# Patient Record
Sex: Female | Born: 1960 | Race: White | Hispanic: No | Marital: Married | Smoking: Current every day smoker
Health system: Southern US, Community
[De-identification: ages and names within clinical notes are randomized; demographics above are authoritative.]

## PROBLEM LIST (undated history)

## (undated) DIAGNOSIS — I10 Essential (primary) hypertension: Secondary | ICD-10-CM

## (undated) DIAGNOSIS — J45909 Unspecified asthma, uncomplicated: Secondary | ICD-10-CM

## (undated) DIAGNOSIS — M545 Low back pain, unspecified: Secondary | ICD-10-CM

## (undated) DIAGNOSIS — M1990 Unspecified osteoarthritis, unspecified site: Secondary | ICD-10-CM

## (undated) DIAGNOSIS — Z8489 Family history of other specified conditions: Secondary | ICD-10-CM

## (undated) DIAGNOSIS — Z8739 Personal history of other diseases of the musculoskeletal system and connective tissue: Secondary | ICD-10-CM

## (undated) DIAGNOSIS — Z87442 Personal history of urinary calculi: Secondary | ICD-10-CM

## (undated) DIAGNOSIS — G2581 Restless legs syndrome: Secondary | ICD-10-CM

## (undated) DIAGNOSIS — Z9109 Other allergy status, other than to drugs and biological substances: Secondary | ICD-10-CM

## (undated) DIAGNOSIS — J302 Other seasonal allergic rhinitis: Secondary | ICD-10-CM

## (undated) DIAGNOSIS — E785 Hyperlipidemia, unspecified: Secondary | ICD-10-CM

## (undated) DIAGNOSIS — R748 Abnormal levels of other serum enzymes: Secondary | ICD-10-CM

## (undated) DIAGNOSIS — N200 Calculus of kidney: Secondary | ICD-10-CM

## (undated) DIAGNOSIS — G8929 Other chronic pain: Secondary | ICD-10-CM

## (undated) DIAGNOSIS — K219 Gastro-esophageal reflux disease without esophagitis: Secondary | ICD-10-CM

## (undated) DIAGNOSIS — G5600 Carpal tunnel syndrome, unspecified upper limb: Secondary | ICD-10-CM

## (undated) DIAGNOSIS — J42 Unspecified chronic bronchitis: Secondary | ICD-10-CM

## (undated) HISTORY — PX: ABDOMINAL HYSTERECTOMY: SHX81

## (undated) HISTORY — PX: APPENDECTOMY: SHX54

## (undated) HISTORY — PX: TONSILLECTOMY: SUR1361

## (undated) HISTORY — PX: FRACTURE SURGERY: SHX138

## (undated) HISTORY — PX: CYSTOSCOPY W/ STONE MANIPULATION: SHX1427

## (undated) HISTORY — PX: ANKLE SURGERY: SHX546

## (undated) HISTORY — PX: JOINT REPLACEMENT: SHX530

---

## 1998-11-05 ENCOUNTER — Inpatient Hospital Stay (HOSPITAL_COMMUNITY): Admission: EM | Admit: 1998-11-05 | Discharge: 1998-11-09 | Payer: Self-pay | Admitting: Emergency Medicine

## 1998-11-06 ENCOUNTER — Encounter: Payer: Self-pay | Admitting: Orthopedic Surgery

## 1998-11-06 ENCOUNTER — Encounter: Payer: Self-pay | Admitting: Emergency Medicine

## 1999-03-28 ENCOUNTER — Other Ambulatory Visit: Admission: RE | Admit: 1999-03-28 | Discharge: 1999-03-28 | Payer: Self-pay | Admitting: Obstetrics and Gynecology

## 2000-05-19 ENCOUNTER — Other Ambulatory Visit: Admission: RE | Admit: 2000-05-19 | Discharge: 2000-05-19 | Payer: Self-pay | Admitting: Obstetrics and Gynecology

## 2001-10-04 ENCOUNTER — Other Ambulatory Visit: Admission: RE | Admit: 2001-10-04 | Discharge: 2001-10-04 | Payer: Self-pay | Admitting: Obstetrics and Gynecology

## 2003-12-13 ENCOUNTER — Other Ambulatory Visit: Admission: RE | Admit: 2003-12-13 | Discharge: 2003-12-13 | Payer: Self-pay | Admitting: Obstetrics and Gynecology

## 2009-09-26 ENCOUNTER — Encounter: Admission: RE | Admit: 2009-09-26 | Discharge: 2009-09-26 | Payer: Self-pay | Admitting: Podiatry

## 2012-06-18 ENCOUNTER — Emergency Department (HOSPITAL_BASED_OUTPATIENT_CLINIC_OR_DEPARTMENT_OTHER)
Admission: EM | Admit: 2012-06-18 | Discharge: 2012-06-18 | Disposition: A | Discharge status: Home / Self Care | Payer: Medicare Other | Attending: Emergency Medicine | Admitting: Emergency Medicine

## 2012-06-18 ENCOUNTER — Emergency Department (HOSPITAL_BASED_OUTPATIENT_CLINIC_OR_DEPARTMENT_OTHER): Payer: Medicare Other

## 2012-06-18 ENCOUNTER — Encounter (HOSPITAL_BASED_OUTPATIENT_CLINIC_OR_DEPARTMENT_OTHER): Payer: Self-pay | Admitting: *Deleted

## 2012-06-18 DIAGNOSIS — Y929 Unspecified place or not applicable: Secondary | ICD-10-CM | POA: Insufficient documentation

## 2012-06-18 DIAGNOSIS — I10 Essential (primary) hypertension: Secondary | ICD-10-CM | POA: Insufficient documentation

## 2012-06-18 DIAGNOSIS — Y9301 Activity, walking, marching and hiking: Secondary | ICD-10-CM | POA: Insufficient documentation

## 2012-06-18 DIAGNOSIS — S20219A Contusion of unspecified front wall of thorax, initial encounter: Secondary | ICD-10-CM | POA: Insufficient documentation

## 2012-06-18 DIAGNOSIS — S0990XA Unspecified injury of head, initial encounter: Secondary | ICD-10-CM | POA: Insufficient documentation

## 2012-06-18 DIAGNOSIS — W010XXA Fall on same level from slipping, tripping and stumbling without subsequent striking against object, initial encounter: Secondary | ICD-10-CM | POA: Insufficient documentation

## 2012-06-18 DIAGNOSIS — F17200 Nicotine dependence, unspecified, uncomplicated: Secondary | ICD-10-CM | POA: Insufficient documentation

## 2012-06-18 DIAGNOSIS — K219 Gastro-esophageal reflux disease without esophagitis: Secondary | ICD-10-CM | POA: Insufficient documentation

## 2012-06-18 DIAGNOSIS — Z79899 Other long term (current) drug therapy: Secondary | ICD-10-CM | POA: Insufficient documentation

## 2012-06-18 DIAGNOSIS — S20212A Contusion of left front wall of thorax, initial encounter: Secondary | ICD-10-CM

## 2012-06-18 HISTORY — DX: Gastro-esophageal reflux disease without esophagitis: K21.9

## 2012-06-18 HISTORY — DX: Essential (primary) hypertension: I10

## 2012-06-18 MED ORDER — HYDROCODONE-ACETAMINOPHEN 5-325 MG PO TABS: 2.0000 | ORAL_TABLET | ORAL | Status: DC | PRN

## 2012-06-18 MED ORDER — ACETAMINOPHEN 325 MG PO TABS
ORAL_TABLET | ORAL | Status: AC
  Administered 2012-06-18: 650 mg
  Filled 2012-06-18: qty 2

## 2012-06-18 NOTE — ED Provider Notes (Signed)
History     CSN: [id number]  Arrival date & time 06/18/12  1301   First MD Initiated Contact with Patient 06/18/12 1333      Chief Complaint  Patient presents with  . Fall    (Consider location/radiation/quality/duration/timing/severity/associated sxs/prior treatment) Patient is a 52 y.o. female presenting with fall. The history is provided by the patient. No language interpreter was used.  Fall The accident occurred 3 to 5 hours ago. The fall occurred while walking. She fell from a height of 1 to 2 ft. There was no blood loss. Point of impact: left ribs. The pain is at a severity of 5/10. The pain is moderate. She was not ambulatory at the scene. There was no entrapment after the fall. There was no drug use involved in the accident. There was no alcohol use involved in the accident. Associated symptoms include headaches. Pertinent negatives include no abdominal pain, no nausea, no vomiting and no loss of consciousness. The symptoms are aggravated by pressure on the injury. She has tried nothing for the symptoms.  Pt complains of falling and hitting right side of chest on the tub.  Past Medical History  Diagnosis Date  . Hypertension   . GERD (gastroesophageal reflux disease)     Past Surgical History  Procedure Laterality Date  . Tonsillectomy    . Abdominal hysterectomy      No family history on file.  History  Substance Use Topics  . Smoking status: Current Every Day Smoker -- 0.50 packs/day    Types: Cigarettes  . Smokeless tobacco: Not on file  . Alcohol Use: Yes    OB History   Grav Para Term Preterm Abortions TAB SAB Ect Mult Living                  Review of Systems  Respiratory: Negative for chest tightness and shortness of breath.   Gastrointestinal: Negative for nausea, vomiting and abdominal pain.  Neurological: Positive for headaches. Negative for loss of consciousness.  All other systems reviewed and are negative.    Allergies  Sulfa  antibiotics  Home Medications   Current Outpatient Rx  Name  Route  Sig  Dispense  Refill  . Esomeprazole Magnesium (NEXIUM PO)   Oral   Take by mouth.         Marland Kitchen HYDROCHLOROTHIAZIDE PO   Oral   Take by mouth.           BP 124/92  Pulse 101  Temp(Src) 98.8 F (37.1 C) (Oral)  Resp 20  SpO2 100%  Physical Exam  Nursing note and vitals reviewed. Constitutional: She appears well-developed and well-nourished.  HENT:  Head: Normocephalic.  Right Ear: External ear normal.  Left Ear: External ear normal.  Eyes: Conjunctivae and EOM are normal. Pupils are equal, round, and reactive to light.  Neck: Normal range of motion. Neck supple.  Pulmonary/Chest: Effort normal and breath sounds normal. She exhibits tenderness.  Abdominal: Soft.  Musculoskeletal: Normal range of motion.  Neurological: She is alert.  Skin: Skin is warm.    ED Course  Procedures (including critical care time)  Labs Reviewed - No data to display No results found.   1. Contusion, chest wall, left, initial encounter       MDM  Left ribs  No obvious fracture.   Pt given rx for hydrocodone.  Pt advised to recheck with her Md in 3-4 days     Date: 06/18/2012  Rate: 92  Rhythm: normal sinus rhythm  QRS Axis: normal  Intervals: normal  ST/T Wave abnormalities: normal  Conduction Disutrbances:none  Narrative Interpretation:   Old EKG Reviewed: none available    Elson Areas, PA-C 06/18/12 1453  Lonia Skinner [place], PA-C 06/18/12 1455  Lonia Skinner [place], [place] 06/18/12 1457

## 2012-06-18 NOTE — ED Provider Notes (Signed)
Medical screening examination/treatment/procedure(s) were performed by non-physician practitioner and as supervising physician I was immediately available for consultation/collaboration.  Doug Sou, MD 06/18/12 [phone]

## 2012-06-18 NOTE — ED Notes (Signed)
Family at bedside. 

## 2012-06-18 NOTE — ED Notes (Signed)
sllipped on water and fell. Pain in her left scapula and shoulder. Pressure in her left chest and numbness to her left hand.

## 2015-03-14 ENCOUNTER — Encounter (HOSPITAL_COMMUNITY): Payer: Self-pay | Admitting: Emergency Medicine

## 2015-03-14 ENCOUNTER — Emergency Department (HOSPITAL_COMMUNITY)
Admission: EM | Admit: 2015-03-14 | Discharge: 2015-03-14 | Disposition: A | Discharge status: Home / Self Care | Payer: Medicare HMO | Attending: Emergency Medicine | Admitting: Emergency Medicine

## 2015-03-14 ENCOUNTER — Emergency Department (HOSPITAL_COMMUNITY): Payer: Medicare HMO

## 2015-03-14 DIAGNOSIS — Y998 Other external cause status: Secondary | ICD-10-CM | POA: Diagnosis not present

## 2015-03-14 DIAGNOSIS — Y92000 Kitchen of unspecified non-institutional (private) residence as  the place of occurrence of the external cause: Secondary | ICD-10-CM | POA: Insufficient documentation

## 2015-03-14 DIAGNOSIS — S42202A Unspecified fracture of upper end of left humerus, initial encounter for closed fracture: Secondary | ICD-10-CM | POA: Insufficient documentation

## 2015-03-14 DIAGNOSIS — K219 Gastro-esophageal reflux disease without esophagitis: Secondary | ICD-10-CM | POA: Insufficient documentation

## 2015-03-14 DIAGNOSIS — Z79899 Other long term (current) drug therapy: Secondary | ICD-10-CM | POA: Insufficient documentation

## 2015-03-14 DIAGNOSIS — Z7951 Long term (current) use of inhaled steroids: Secondary | ICD-10-CM | POA: Diagnosis not present

## 2015-03-14 DIAGNOSIS — Y9389 Activity, other specified: Secondary | ICD-10-CM | POA: Diagnosis not present

## 2015-03-14 DIAGNOSIS — I10 Essential (primary) hypertension: Secondary | ICD-10-CM | POA: Diagnosis not present

## 2015-03-14 DIAGNOSIS — F17210 Nicotine dependence, cigarettes, uncomplicated: Secondary | ICD-10-CM | POA: Insufficient documentation

## 2015-03-14 DIAGNOSIS — W010XXA Fall on same level from slipping, tripping and stumbling without subsequent striking against object, initial encounter: Secondary | ICD-10-CM | POA: Diagnosis not present

## 2015-03-14 DIAGNOSIS — S4992XA Unspecified injury of left shoulder and upper arm, initial encounter: Secondary | ICD-10-CM | POA: Diagnosis present

## 2015-03-14 DIAGNOSIS — S42302A Unspecified fracture of shaft of humerus, left arm, initial encounter for closed fracture: Secondary | ICD-10-CM

## 2015-03-14 MED ORDER — HYDROCODONE-ACETAMINOPHEN 5-325 MG PO TABS: 1.0000 | ORAL_TABLET | Freq: Four times a day (QID) | ORAL | Status: DC | PRN

## 2015-03-14 MED ORDER — FENTANYL CITRATE (PF) 100 MCG/2ML IJ SOLN
50.0000 ug | Freq: Once | INTRAMUSCULAR | Status: AC
  Administered 2015-03-14: 50 ug via INTRAMUSCULAR
  Filled 2015-03-14: qty 2

## 2015-03-14 NOTE — ED Notes (Signed)
Bed: WA06 Expected date:  Expected time:  Means of arrival:  Comments: 

## 2015-03-14 NOTE — Progress Notes (Signed)
CSW spoke with patient at bedside. No family was present during this encounter. Patient reports she resides at home. Patient reports she was "letting the dog in" and she tripped and fell over the [place] in her kitchen. Patient reports she can complete ADL's on her own. Patient reports her husband's name is Kloie Whiting. No questions noted for CSW at this time.   Eliyana Pagliaro, husband, [phone]  Elenore Paddy [phone] ED CSW 03/14/2015 10:14 AM

## 2015-03-14 NOTE — Discharge Instructions (Signed)
As discussed, with your arm fracture is very important that you monitor your condition carefully, use the sling as directed.  Please be sure to follow-up with her orthopedic physician in one week for repeat evaluation.  Return here for any concerning changes in your condition.

## 2015-03-14 NOTE — ED Notes (Signed)
Per [facility] pt from home had fall last night , ETOH on board per ems, had bottle of wine last night. Reports left shoulder pain , no obvious deformity per ems. Pt able to move left arm yet painful ROM. denies loc nor neck pain . Alert and oriented x 4.

## 2015-03-14 NOTE — ED Provider Notes (Signed)
CSN: [id number]     Arrival date & time 03/14/15  0827 History   First MD Initiated Contact with Patient 03/14/15 [phone]     Chief Complaint  Patient presents with  . Shoulder Injury    left  . Fall    HPI  Patient presents immediately after sustaining an injury to her left shoulder following a fall. Patient acknowledges using alcohol yesterday, denies any alcohol today. She states that just prior to calling EMS, she stumbled, fell against a countertop. She denies head trauma, confusion, disorientation, loss of consciousness, nausea, vomiting, neck pain. Since the event she has had severe sharp pain in the left shoulder, worse with any motion. No medication taken for relief. Patient denies detachment beyond hypertension. She acknowledges substantial life stress. She also smokes, drinks, including one bottle of wine last night.   Smoking cessation provided, particularly in light of this patient's evaluation in the ED.   Past Medical History  Diagnosis Date  . Hypertension   . GERD (gastroesophageal reflux disease)    Past Surgical History  Procedure Laterality Date  . Tonsillectomy    . Abdominal hysterectomy     No family history on file. Social History  Substance Use Topics  . Smoking status: Current Every Day Smoker -- 0.50 packs/day    Types: Cigarettes  . Smokeless tobacco: None  . Alcohol Use: Yes   OB History    No data available     Review of Systems  Constitutional: Negative for fever.  Respiratory: Negative for shortness of breath.   Cardiovascular: Negative for chest pain.  Musculoskeletal:       Negative aside from HPI  Skin:       Negative aside from HPI  Allergic/Immunologic: Negative for immunocompromised state.  Neurological: Negative for weakness.      Allergies  Sulfa antibiotics  Home Medications   Prior to Admission medications   Medication Sig Start Date End Date Taking? Authorizing Provider  ADVAIR DISKUS 100-50 MCG/DOSE AEPB  Inhale 1 puff into the lungs every 12 (twelve) hours. 12/06/14  Yes Historical Provider, MD  albuterol (PROVENTIL HFA;VENTOLIN HFA) 108 (90 Base) MCG/ACT inhaler Inhale 2 puffs into the lungs every 6 (six) hours as needed for wheezing or shortness of breath.   Yes Historical Provider, MD  fluticasone (FLONASE) 50 MCG/ACT nasal spray Place 2 sprays into both nostrils daily as needed for allergies.  02/10/15  Yes Historical Provider, MD  guaiFENesin-dextromethorphan (ROBITUSSIN DM) 100-10 MG/5ML syrup Take 10 mLs by mouth every 4 (four) hours as needed for cough.   Yes Historical Provider, MD  hydrochlorothiazide (HYDRODIURIL) 25 MG tablet Take 25 mg by mouth daily.   Yes Historical Provider, MD  Multiple Vitamin (MULTIVITAMIN WITH MINERALS) TABS tablet Take 1 tablet by mouth daily.   Yes Historical Provider, MD  pantoprazole (PROTONIX) 40 MG tablet Take 40 mg by mouth daily. 02/14/15  Yes Historical Provider, MD  tobramycin (TOBREX) 0.3 % ophthalmic solution Place 1-2 drops into both eyes every 4 (four) hours.  02/17/15  Yes Historical Provider, MD  HYDROcodone-acetaminophen (NORCO/VICODIN) 5-325 MG tablet Take 1 tablet by mouth every 6 (six) hours as needed for severe pain. 03/14/15   Gerhard Munch, MD   BP 136/81 mmHg  Pulse 96  Temp(Src) 97.6 F (36.4 C) (Oral)  Resp 20  SpO2 96% Physical Exam  Constitutional: She is oriented to person, place, and time. She appears well-developed and well-nourished. No distress.  HENT:  Head: Normocephalic and atraumatic.  Eyes: Conjunctivae  and EOM are normal.  Cardiovascular: Normal rate and regular rhythm.   Pulmonary/Chest: Effort normal and breath sounds normal. No stridor. No respiratory distress.  Abdominal: She exhibits no distension.  Musculoskeletal: She exhibits no edema.       Left shoulder: She exhibits decreased range of motion, tenderness, bony tenderness, swelling and deformity.       Left elbow: Normal.       Left wrist: Normal.   Clinical dislocation  Neurological: She is alert and oriented to person, place, and time. No cranial nerve deficit.  Skin: Skin is warm and dry.  Psychiatric: She has a normal mood and affect.  Nursing note and vitals reviewed.   ED Course  Procedures (including critical care time)  Imaging Review Dg Shoulder Left  03/14/2015  CLINICAL DATA:  Status post fall at home last night with a left shoulder injury. Pain. Initial encounter. EXAM: LEFT SHOULDER - 2+ VIEW COMPARISON:  None. FINDINGS: The patient has a mildly comminuted fracture of the proximal diaphysis of the left humerus. There is medial distraction of a large bony fragment. The humeral head is located and the acromioclavicular joint is intact. Imaged left lung and ribs are unremarkable. IMPRESSION: Acute fracture of the proximal diaphysis of the left humerus. Electronically Signed   By: Drusilla Kanner M.D.   On: 03/14/2015 09:25   I have personally reviewed and evaluated these images and lab results as part of my medical decision-making.  On repeat exam the patient is awake, alert. Patient has been placed in a left arm and shoulder sling. This is well tolerated.  I discussed patient's case with our orthopedist on call. The patient will follow-up with orthopedist within 1 week.   MDM   Final diagnoses:  Humerus fracture, left, closed, initial encounter   Patient presents after mechanical fall. Patient has left upper arm pain, deformity, but is distally neurovascularly intact. No other notable injuries. Patient is found to have comminuted humerus fracture. After discussion with our orthopedic team, the patient was discharged in stable condition with analgesia.  Gerhard Munch, MD 03/14/15 1043

## 2015-03-21 ENCOUNTER — Other Ambulatory Visit: Payer: Self-pay | Admitting: Family Medicine

## 2015-03-21 DIAGNOSIS — Z1231 Encounter for screening mammogram for malignant neoplasm of breast: Secondary | ICD-10-CM

## 2015-04-02 ENCOUNTER — Ambulatory Visit: Payer: Medicare HMO

## 2015-04-30 ENCOUNTER — Other Ambulatory Visit: Payer: Self-pay | Admitting: Orthopedic Surgery

## 2015-04-30 DIAGNOSIS — M25512 Pain in left shoulder: Secondary | ICD-10-CM

## 2015-05-01 ENCOUNTER — Ambulatory Visit
Admission: RE | Admit: 2015-05-01 | Discharge: 2015-05-01 | Disposition: A | Discharge status: Home / Self Care | Payer: Medicare HMO | Source: Ambulatory Visit | Attending: Orthopedic Surgery | Admitting: Orthopedic Surgery

## 2015-05-01 DIAGNOSIS — M25512 Pain in left shoulder: Secondary | ICD-10-CM

## 2015-05-02 ENCOUNTER — Encounter (HOSPITAL_COMMUNITY): Payer: Self-pay | Admitting: *Deleted

## 2015-05-02 ENCOUNTER — Other Ambulatory Visit: Payer: Self-pay | Admitting: Orthopedic Surgery

## 2015-05-02 NOTE — Progress Notes (Signed)
Pt denies cardiac history, chest pain or sob. 

## 2015-05-03 ENCOUNTER — Observation Stay (HOSPITAL_COMMUNITY)
Admission: RE | Admit: 2015-05-03 | Discharge: 2015-05-05 | Disposition: A | Discharge status: Home / Self Care | Payer: Medicare HMO | Source: Ambulatory Visit | Attending: Orthopedic Surgery | Admitting: Orthopedic Surgery

## 2015-05-03 ENCOUNTER — Encounter (HOSPITAL_COMMUNITY): Payer: Self-pay | Admitting: *Deleted

## 2015-05-03 ENCOUNTER — Ambulatory Visit (HOSPITAL_COMMUNITY): Payer: Medicare HMO | Admitting: Certified Registered"

## 2015-05-03 ENCOUNTER — Ambulatory Visit (HOSPITAL_COMMUNITY): Payer: Medicare HMO

## 2015-05-03 ENCOUNTER — Encounter (HOSPITAL_COMMUNITY)
Admission: RE | Disposition: A | Discharge status: Home / Self Care | Payer: Self-pay | Source: Ambulatory Visit | Attending: Orthopedic Surgery

## 2015-05-03 DIAGNOSIS — F17210 Nicotine dependence, cigarettes, uncomplicated: Secondary | ICD-10-CM | POA: Insufficient documentation

## 2015-05-03 DIAGNOSIS — S42292K Other displaced fracture of upper end of left humerus, subsequent encounter for fracture with nonunion: Secondary | ICD-10-CM | POA: Insufficient documentation

## 2015-05-03 DIAGNOSIS — G8929 Other chronic pain: Secondary | ICD-10-CM | POA: Insufficient documentation

## 2015-05-03 DIAGNOSIS — I10 Essential (primary) hypertension: Secondary | ICD-10-CM | POA: Insufficient documentation

## 2015-05-03 DIAGNOSIS — S4292XA Fracture of left shoulder girdle, part unspecified, initial encounter for closed fracture: Secondary | ICD-10-CM | POA: Diagnosis present

## 2015-05-03 DIAGNOSIS — S42352K Displaced comminuted fracture of shaft of humerus, left arm, subsequent encounter for fracture with nonunion: Secondary | ICD-10-CM | POA: Diagnosis not present

## 2015-05-03 DIAGNOSIS — Z79891 Long term (current) use of opiate analgesic: Secondary | ICD-10-CM | POA: Insufficient documentation

## 2015-05-03 DIAGNOSIS — S42232K 3-part fracture of surgical neck of left humerus, subsequent encounter for fracture with nonunion: Principal | ICD-10-CM | POA: Insufficient documentation

## 2015-05-03 DIAGNOSIS — Z419 Encounter for procedure for purposes other than remedying health state, unspecified: Secondary | ICD-10-CM

## 2015-05-03 DIAGNOSIS — X58XXXD Exposure to other specified factors, subsequent encounter: Secondary | ICD-10-CM | POA: Insufficient documentation

## 2015-05-03 DIAGNOSIS — K219 Gastro-esophageal reflux disease without esophagitis: Secondary | ICD-10-CM | POA: Insufficient documentation

## 2015-05-03 DIAGNOSIS — M545 Low back pain: Secondary | ICD-10-CM | POA: Insufficient documentation

## 2015-05-03 DIAGNOSIS — J45909 Unspecified asthma, uncomplicated: Secondary | ICD-10-CM | POA: Insufficient documentation

## 2015-05-03 DIAGNOSIS — M19072 Primary osteoarthritis, left ankle and foot: Secondary | ICD-10-CM | POA: Insufficient documentation

## 2015-05-03 DIAGNOSIS — Z79899 Other long term (current) drug therapy: Secondary | ICD-10-CM | POA: Insufficient documentation

## 2015-05-03 HISTORY — DX: Carpal tunnel syndrome, unspecified upper limb: G56.00

## 2015-05-03 HISTORY — DX: Restless legs syndrome: G25.81

## 2015-05-03 HISTORY — DX: Unspecified asthma, uncomplicated: J45.909

## 2015-05-03 HISTORY — DX: Low back pain: M54.5

## 2015-05-03 HISTORY — DX: Other chronic pain: G89.29

## 2015-05-03 HISTORY — DX: Family history of other specified conditions: Z84.89

## 2015-05-03 HISTORY — DX: Calculus of kidney: N20.0

## 2015-05-03 HISTORY — DX: Unspecified chronic bronchitis: J42

## 2015-05-03 HISTORY — DX: Other seasonal allergic rhinitis: J30.2

## 2015-05-03 HISTORY — PX: ORIF HUMERUS FRACTURE: SHX2126

## 2015-05-03 HISTORY — DX: Low back pain, unspecified: M54.50

## 2015-05-03 HISTORY — DX: Hyperlipidemia, unspecified: E78.5

## 2015-05-03 HISTORY — DX: Personal history of other diseases of the musculoskeletal system and connective tissue: Z87.39

## 2015-05-03 HISTORY — DX: Unspecified osteoarthritis, unspecified site: M19.90

## 2015-05-03 HISTORY — DX: Other allergy status, other than to drugs and biological substances: Z91.09

## 2015-05-03 HISTORY — DX: Abnormal levels of other serum enzymes: R74.8

## 2015-05-03 LAB — COMPREHENSIVE METABOLIC PANEL
ALBUMIN: 4.2 g/dL (ref 3.5–5.0)
ALT: 24 U/L (ref 14–54)
ANION GAP: 13 (ref 5–15)
AST: 30 U/L (ref 15–41)
Alkaline Phosphatase: 74 U/L (ref 38–126)
BUN: 9 mg/dL (ref 6–20)
CHLORIDE: 110 mmol/L (ref 101–111)
CO2: 21 mmol/L — AB (ref 22–32)
Calcium: 10.3 mg/dL (ref 8.9–10.3)
Creatinine, Ser: 0.66 mg/dL (ref 0.44–1.00)
GFR calc non Af Amer: >60 mL/min (ref >60)
Glucose, Bld: 94 mg/dL (ref 65–99)
Potassium: 3.6 mmol/L (ref 3.5–5.1)
SODIUM: 144 mmol/L (ref 135–145)
Total Bilirubin: 0.7 mg/dL (ref 0.3–1.2)
Total Protein: 7 g/dL (ref 6.5–8.1)

## 2015-05-03 LAB — CBC
HEMATOCRIT: 39.2 % (ref 36.0–46.0)
HEMOGLOBIN: 13.4 g/dL (ref 12.0–15.0)
MCH: 33.1 pg (ref 26.0–34.0)
MCHC: 34.2 g/dL (ref 30.0–36.0)
MCV: 96.8 fL (ref 78.0–100.0)
Platelets: 284 10*3/uL (ref 150–400)
RBC: 4.05 MIL/uL (ref 3.87–5.11)
RDW: 13.2 % (ref 11.5–15.5)
WBC: 9.2 10*3/uL (ref 4.0–10.5)

## 2015-05-03 SURGERY — OPEN REDUCTION INTERNAL FIXATION (ORIF) PROXIMAL HUMERUS FRACTURE
Anesthesia: General | Site: Shoulder | Laterality: Left

## 2015-05-03 MED ORDER — ONDANSETRON HCL 4 MG/2ML IJ SOLN
4.0000 mg | Freq: Four times a day (QID) | INTRAMUSCULAR | Status: DC | PRN
Start: 2015-05-03 — End: 2015-05-05

## 2015-05-03 MED ORDER — HYDROMORPHONE HCL 1 MG/ML IJ SOLN
INTRAMUSCULAR | Status: AC
  Administered 2015-05-03: 0.5 mg via INTRAVENOUS
  Filled 2015-05-03: qty 1

## 2015-05-03 MED ORDER — METHOCARBAMOL 1000 MG/10ML IJ SOLN: 500.0000 mg | Freq: Four times a day (QID) | INTRAMUSCULAR | Status: DC | PRN

## 2015-05-03 MED ORDER — LACTATED RINGERS IV SOLN
INTRAVENOUS | Status: DC
  Administered 2015-05-03: 10 mL/h via INTRAVENOUS
  Administered 2015-05-03 (×2): via INTRAVENOUS

## 2015-05-03 MED ORDER — CHLORHEXIDINE GLUCONATE 4 % EX LIQD: 60.0000 mL | Freq: Once | CUTANEOUS | Status: DC

## 2015-05-03 MED ORDER — ADULT MULTIVITAMIN W/MINERALS CH
1.0000 | ORAL_TABLET | Freq: Every day | ORAL | Status: DC
  Administered 2015-05-04 – 2015-05-05 (×2): 1 via ORAL
  Filled 2015-05-03 (×2): qty 1

## 2015-05-03 MED ORDER — PHENYLEPHRINE 40 MCG/ML (10ML) SYRINGE FOR IV PUSH (FOR BLOOD PRESSURE SUPPORT)
PREFILLED_SYRINGE | INTRAVENOUS | Status: AC
  Filled 2015-05-03: qty 10

## 2015-05-03 MED ORDER — CEFAZOLIN SODIUM-DEXTROSE 2-3 GM-% IV SOLR
2.0000 g | INTRAVENOUS | Status: AC
  Administered 2015-05-03: 2 g via INTRAVENOUS
  Filled 2015-05-03: qty 50

## 2015-05-03 MED ORDER — PANTOPRAZOLE SODIUM 40 MG PO TBEC
40.0000 mg | DELAYED_RELEASE_TABLET | Freq: Every day | ORAL | Status: DC
  Administered 2015-05-04 – 2015-05-05 (×2): 40 mg via ORAL
  Filled 2015-05-03 (×2): qty 1

## 2015-05-03 MED ORDER — FENTANYL CITRATE (PF) 250 MCG/5ML IJ SOLN
INTRAMUSCULAR | Status: AC
  Filled 2015-05-03: qty 5

## 2015-05-03 MED ORDER — LIDOCAINE HCL (CARDIAC) 20 MG/ML IV SOLN
INTRAVENOUS | Status: DC | PRN
  Administered 2015-05-03: 100 mg via INTRAVENOUS

## 2015-05-03 MED ORDER — MEPERIDINE HCL 25 MG/ML IJ SOLN: 6.2500 mg | INTRAMUSCULAR | Status: DC | PRN

## 2015-05-03 MED ORDER — OXYCODONE HCL 5 MG PO TABS
5.0000 mg | ORAL_TABLET | ORAL | Status: DC | PRN
  Administered 2015-05-03: 5 mg via ORAL
  Administered 2015-05-04 – 2015-05-05 (×6): 10 mg via ORAL
  Filled 2015-05-03 (×7): qty 2

## 2015-05-03 MED ORDER — VITAMIN D 1000 UNITS PO TABS
2000.0000 [IU] | ORAL_TABLET | Freq: Every day | ORAL | Status: DC
  Administered 2015-05-05: 2000 [IU] via ORAL
  Filled 2015-05-03 (×2): qty 2

## 2015-05-03 MED ORDER — METHOCARBAMOL 500 MG PO TABS
500.0000 mg | ORAL_TABLET | Freq: Four times a day (QID) | ORAL | Status: DC | PRN
  Administered 2015-05-03 – 2015-05-05 (×3): 500 mg via ORAL
  Filled 2015-05-03 (×3): qty 1

## 2015-05-03 MED ORDER — ACETAMINOPHEN 650 MG RE SUPP: 650.0000 mg | Freq: Four times a day (QID) | RECTAL | Status: DC | PRN

## 2015-05-03 MED ORDER — CEFAZOLIN SODIUM-DEXTROSE 2-3 GM-% IV SOLR
2.0000 g | Freq: Four times a day (QID) | INTRAVENOUS | Status: AC
  Administered 2015-05-04 (×2): 2 g via INTRAVENOUS
  Filled 2015-05-03 (×2): qty 50

## 2015-05-03 MED ORDER — ROCURONIUM BROMIDE 100 MG/10ML IV SOLN
INTRAVENOUS | Status: DC | PRN
  Administered 2015-05-03: 30 mg via INTRAVENOUS
  Administered 2015-05-03: 40 mg via INTRAVENOUS
  Administered 2015-05-03: 10 mg via INTRAVENOUS

## 2015-05-03 MED ORDER — METOCLOPRAMIDE HCL 5 MG PO TABS: 5.0000 mg | ORAL_TABLET | Freq: Three times a day (TID) | ORAL | Status: DC | PRN

## 2015-05-03 MED ORDER — SUGAMMADEX SODIUM 200 MG/2ML IV SOLN
INTRAVENOUS | Status: AC
  Filled 2015-05-03: qty 2

## 2015-05-03 MED ORDER — PROPOFOL 10 MG/ML IV BOLUS
INTRAVENOUS | Status: DC | PRN
  Administered 2015-05-03: 30 mg via INTRAVENOUS
  Administered 2015-05-03: 170 mg via INTRAVENOUS

## 2015-05-03 MED ORDER — ONDANSETRON HCL 4 MG PO TABS: 4.0000 mg | ORAL_TABLET | Freq: Four times a day (QID) | ORAL | Status: DC | PRN

## 2015-05-03 MED ORDER — SUGAMMADEX SODIUM 500 MG/5ML IV SOLN
INTRAVENOUS | Status: AC
  Filled 2015-05-03: qty 5

## 2015-05-03 MED ORDER — ACETAMINOPHEN 325 MG PO TABS: 650.0000 mg | ORAL_TABLET | Freq: Four times a day (QID) | ORAL | Status: DC | PRN

## 2015-05-03 MED ORDER — ONDANSETRON HCL 4 MG/2ML IJ SOLN
INTRAMUSCULAR | Status: DC | PRN
  Administered 2015-05-03: 4 mg via INTRAVENOUS

## 2015-05-03 MED ORDER — MORPHINE SULFATE (PF) 2 MG/ML IV SOLN
2.0000 mg | INTRAVENOUS | Status: DC | PRN
  Administered 2015-05-04: 2 mg via INTRAVENOUS
  Filled 2015-05-03: qty 1

## 2015-05-03 MED ORDER — PROPOFOL 10 MG/ML IV BOLUS
INTRAVENOUS | Status: AC
  Filled 2015-05-03: qty 20

## 2015-05-03 MED ORDER — DEXAMETHASONE SODIUM PHOSPHATE 4 MG/ML IJ SOLN
INTRAMUSCULAR | Status: DC | PRN
  Administered 2015-05-03: 4 mg via INTRAVENOUS

## 2015-05-03 MED ORDER — BUPIVACAINE HCL (PF) 0.25 % IJ SOLN: INTRAMUSCULAR | Status: DC | PRN

## 2015-05-03 MED ORDER — EPHEDRINE SULFATE 50 MG/ML IJ SOLN
INTRAMUSCULAR | Status: DC | PRN
  Administered 2015-05-03 (×3): 10 mg via INTRAVENOUS

## 2015-05-03 MED ORDER — FENTANYL CITRATE (PF) 100 MCG/2ML IJ SOLN
50.0000 ug | INTRAMUSCULAR | Status: AC | PRN
  Administered 2015-05-03: 100 ug via INTRAVENOUS
  Administered 2015-05-03 (×2): 50 ug via INTRAVENOUS
  Administered 2015-05-03: 100 ug via INTRAVENOUS
  Administered 2015-05-03 (×2): 50 ug via INTRAVENOUS
  Administered 2015-05-03 (×2): 100 ug via INTRAVENOUS

## 2015-05-03 MED ORDER — FLUTICASONE PROPIONATE 50 MCG/ACT NA SUSP: 2.0000 | Freq: Every day | NASAL | Status: DC | PRN

## 2015-05-03 MED ORDER — HYDROCHLOROTHIAZIDE 25 MG PO TABS
25.0000 mg | ORAL_TABLET | Freq: Every day | ORAL | Status: DC
  Administered 2015-05-04 – 2015-05-05 (×2): 25 mg via ORAL
  Filled 2015-05-03 (×2): qty 1

## 2015-05-03 MED ORDER — MIDAZOLAM HCL 2 MG/2ML IJ SOLN
INTRAMUSCULAR | Status: AC
  Filled 2015-05-03: qty 2

## 2015-05-03 MED ORDER — MIDAZOLAM HCL 2 MG/2ML IJ SOLN
1.0000 mg | INTRAMUSCULAR | Status: DC | PRN
  Administered 2015-05-03 (×2): 2 mg via INTRAVENOUS

## 2015-05-03 MED ORDER — POTASSIUM CHLORIDE ER 10 MEQ PO TBCR
10.0000 meq | EXTENDED_RELEASE_TABLET | Freq: Every day | ORAL | Status: DC
  Administered 2015-05-04 – 2015-05-05 (×2): 10 meq via ORAL
  Filled 2015-05-03 (×6): qty 1

## 2015-05-03 MED ORDER — METOCLOPRAMIDE HCL 5 MG/ML IJ SOLN: 5.0000 mg | Freq: Three times a day (TID) | INTRAMUSCULAR | Status: DC | PRN

## 2015-05-03 MED ORDER — BUPIVACAINE HCL (PF) 0.25 % IJ SOLN
INTRAMUSCULAR | Status: AC
  Filled 2015-05-03: qty 30

## 2015-05-03 MED ORDER — METOCLOPRAMIDE HCL 5 MG/ML IJ SOLN: 10.0000 mg | Freq: Once | INTRAMUSCULAR | Status: DC | PRN

## 2015-05-03 MED ORDER — POTASSIUM CHLORIDE IN NACL 20-0.9 MEQ/L-% IV SOLN
INTRAVENOUS | Status: AC
  Administered 2015-05-04: 04:00:00 via INTRAVENOUS
  Filled 2015-05-03: qty 1000

## 2015-05-03 MED ORDER — PHENYLEPHRINE HCL 10 MG/ML IJ SOLN
INTRAMUSCULAR | Status: DC | PRN
  Administered 2015-05-03 (×3): 80 ug via INTRAVENOUS

## 2015-05-03 MED ORDER — ASPIRIN EC 325 MG PO TBEC
325.0000 mg | DELAYED_RELEASE_TABLET | Freq: Every day | ORAL | Status: DC
  Administered 2015-05-04 – 2015-05-05 (×2): 325 mg via ORAL
  Filled 2015-05-03 (×2): qty 1

## 2015-05-03 MED ORDER — LIDOCAINE HCL (CARDIAC) 20 MG/ML IV SOLN
INTRAVENOUS | Status: AC
  Filled 2015-05-03: qty 5

## 2015-05-03 MED ORDER — PHENOL 1.4 % MT LIQD: 1.0000 | OROMUCOSAL | Status: DC | PRN

## 2015-05-03 MED ORDER — SUGAMMADEX SODIUM 200 MG/2ML IV SOLN
INTRAVENOUS | Status: DC | PRN
  Administered 2015-05-03: 200 mg via INTRAVENOUS

## 2015-05-03 MED ORDER — MIDAZOLAM HCL 2 MG/2ML IJ SOLN
INTRAMUSCULAR | Status: AC
  Administered 2015-05-03: 2 mg via INTRAVENOUS
  Filled 2015-05-03: qty 2

## 2015-05-03 MED ORDER — EPHEDRINE SULFATE 50 MG/ML IJ SOLN
INTRAMUSCULAR | Status: AC
  Filled 2015-05-03: qty 1

## 2015-05-03 MED ORDER — ALBUTEROL SULFATE (2.5 MG/3ML) 0.083% IN NEBU: 2.5000 mg | INHALATION_SOLUTION | Freq: Four times a day (QID) | RESPIRATORY_TRACT | Status: DC | PRN

## 2015-05-03 MED ORDER — MENTHOL 3 MG MT LOZG: 1.0000 | LOZENGE | OROMUCOSAL | Status: DC | PRN

## 2015-05-03 MED ORDER — ROCURONIUM BROMIDE 50 MG/5ML IV SOLN
INTRAVENOUS | Status: AC
  Filled 2015-05-03: qty 1

## 2015-05-03 MED ORDER — FENTANYL CITRATE (PF) 100 MCG/2ML IJ SOLN
INTRAMUSCULAR | Status: AC
  Administered 2015-05-03: 100 ug via INTRAVENOUS
  Filled 2015-05-03: qty 2

## 2015-05-03 MED ORDER — MOMETASONE FURO-FORMOTEROL FUM 100-5 MCG/ACT IN AERO
2.0000 | INHALATION_SPRAY | Freq: Two times a day (BID) | RESPIRATORY_TRACT | Status: DC
  Administered 2015-05-04 – 2015-05-05 (×2): 2 via RESPIRATORY_TRACT
  Filled 2015-05-03 (×2): qty 8.8

## 2015-05-03 MED ORDER — BUPIVACAINE-EPINEPHRINE (PF) 0.5% -1:200000 IJ SOLN
INTRAMUSCULAR | Status: DC | PRN
  Administered 2015-05-03: 30 mL via PERINEURAL

## 2015-05-03 MED ORDER — DOCUSATE SODIUM 100 MG PO CAPS
100.0000 mg | ORAL_CAPSULE | Freq: Two times a day (BID) | ORAL | Status: DC
  Administered 2015-05-04 – 2015-05-05 (×3): 100 mg via ORAL
  Filled 2015-05-03 (×4): qty 1

## 2015-05-03 MED ORDER — 0.9 % SODIUM CHLORIDE (POUR BTL) OPTIME
TOPICAL | Status: DC | PRN
  Administered 2015-05-03 (×4): 1000 mL

## 2015-05-03 MED ORDER — HYDROMORPHONE HCL 1 MG/ML IJ SOLN
0.2500 mg | INTRAMUSCULAR | Status: DC | PRN
  Administered 2015-05-03 (×3): 0.5 mg via INTRAVENOUS

## 2015-05-03 MED ORDER — PANTOPRAZOLE SODIUM 40 MG IV SOLR
20.0000 mg | Freq: Once | INTRAVENOUS | Status: AC
  Administered 2015-05-03: 80 mg via INTRAVENOUS
  Administered 2015-05-03: 40 mg via INTRAVENOUS
  Filled 2015-05-03: qty 20

## 2015-05-03 SURGICAL SUPPLY — 88 items
BANDAGE ELASTIC 4 VELCRO ST LF (GAUZE/BANDAGES/DRESSINGS) IMPLANT
BANDAGE ELASTIC 6 VELCRO ST LF (GAUZE/BANDAGES/DRESSINGS) ×6 IMPLANT
BENZOIN TINCTURE PRP APPL 2/3 (GAUZE/BANDAGES/DRESSINGS) IMPLANT
BIT DRILL 3.2 (BIT) ×2
BIT DRILL 3.2XCALB NS DISP (BIT) ×1 IMPLANT
BIT DRILL 3.5X5.5 QC CALB (BIT) ×3 IMPLANT
BIT DRILL CALIBRATED 2.7 (BIT) ×2 IMPLANT
BIT DRILL CALIBRATED 2.7MM (BIT) ×1
BIT DRL 3.2XCALB NS DISP (BIT) ×1
BNDG COHESIVE 4X5 TAN STRL (GAUZE/BANDAGES/DRESSINGS) ×3 IMPLANT
BUPIVACAINE 0.25% PL IMPLANT
CLOSURE WOUND 1/2 X4 (GAUZE/BANDAGES/DRESSINGS) ×1
COVER SURGICAL LIGHT HANDLE (MISCELLANEOUS) ×3 IMPLANT
DRAIN PENROSE 1/2X12 LTX STRL (WOUND CARE) IMPLANT
DRAPE C-ARM 42X72 X-RAY (DRAPES) IMPLANT
DRAPE IMP U-DRAPE 54X76 (DRAPES) ×6 IMPLANT
DRAPE ORTHO SPLIT 77X108 STRL (DRAPES) ×4
DRAPE SURG ORHT 6 SPLT 77X108 (DRAPES) ×2 IMPLANT
DRAPE U-SHAPE 47X51 STRL (DRAPES) ×6 IMPLANT
DRSG AQUACEL AG ADV 3.5X14 (GAUZE/BANDAGES/DRESSINGS) ×3 IMPLANT
DRSG PAD ABDOMINAL 8X10 ST (GAUZE/BANDAGES/DRESSINGS) IMPLANT
DURAPREP 26ML APPLICATOR (WOUND CARE) ×3 IMPLANT
ELECT REM PT RETURN 9FT ADLT (ELECTROSURGICAL) ×3
ELECTRODE REM PT RTRN 9FT ADLT (ELECTROSURGICAL) ×1 IMPLANT
FACESHIELD WRAPAROUND (MASK) IMPLANT
GAUZE SPONGE 4X4 12PLY STRL (GAUZE/BANDAGES/DRESSINGS) IMPLANT
GAUZE XEROFORM 5X9 LF (GAUZE/BANDAGES/DRESSINGS) IMPLANT
GLOVE BIOGEL PI IND STRL 6.5 (GLOVE) ×2 IMPLANT
GLOVE BIOGEL PI IND STRL 7.0 (GLOVE) ×1 IMPLANT
GLOVE BIOGEL PI IND STRL 8 (GLOVE) ×1 IMPLANT
GLOVE BIOGEL PI INDICATOR 6.5 (GLOVE) ×4
GLOVE BIOGEL PI INDICATOR 7.0 (GLOVE) ×2
GLOVE BIOGEL PI INDICATOR 8 (GLOVE) ×2
GLOVE ECLIPSE 6.5 STRL STRAW (GLOVE) ×3 IMPLANT
GLOVE SURG ORTHO 8.0 STRL STRW (GLOVE) ×6 IMPLANT
GLOVE SURG SS PI 6.5 STRL IVOR (GLOVE) ×6 IMPLANT
GOWN STRL REUS W/ TWL LRG LVL3 (GOWN DISPOSABLE) ×4 IMPLANT
GOWN STRL REUS W/ TWL XL LVL3 (GOWN DISPOSABLE) ×1 IMPLANT
GOWN STRL REUS W/TWL LRG LVL3 (GOWN DISPOSABLE) ×8
GOWN STRL REUS W/TWL XL LVL3 (GOWN DISPOSABLE) ×2
K-WIRE 2X5 SS THRDED S3 (WIRE) ×9
KIT BASIN OR (CUSTOM PROCEDURE TRAY) ×3 IMPLANT
KIT ROOM TURNOVER OR (KITS) ×3 IMPLANT
KWIRE 2X5 SS THRDED S3 (WIRE) ×3 IMPLANT
MANIFOLD NEPTUNE II (INSTRUMENTS) ×3 IMPLANT
NEEDLE 21X1 OR PACK (NEEDLE) IMPLANT
NS IRRIG 1000ML POUR BTL (IV SOLUTION) ×3 IMPLANT
PACK SHOULDER (CUSTOM PROCEDURE TRAY) ×3 IMPLANT
PACK UNIVERSAL I (CUSTOM PROCEDURE TRAY) IMPLANT
PAD ARMBOARD 7.5X6 YLW CONV (MISCELLANEOUS) ×6 IMPLANT
PAD CAST 4YDX4 CTTN HI CHSV (CAST SUPPLIES) ×2 IMPLANT
PADDING CAST COTTON 4X4 STRL (CAST SUPPLIES) ×4
PEG LOCKING 3.2MMX26MM (Peg) ×3 IMPLANT
PEG LOCKING 3.2X 20MM (Peg) ×3 IMPLANT
PEG LOCKING 3.2X 28MM (Peg) ×3 IMPLANT
PEG LOCKING 3.2X36 (Screw) ×6 IMPLANT
PEG LOCKING 3.2X40 (Peg) ×3 IMPLANT
PEG LOCKING 3.2X52 (Peg) ×3 IMPLANT
PENCIL BUTTON HOLSTER BLD 10FT (ELECTRODE) IMPLANT
PLATE PROX HUM LO R 11H 190 ST (Plate) ×3 IMPLANT
SCREW LOCK CORT STAR 3.5X22 (Screw) ×3 IMPLANT
SCREW LOCK CORT STAR 3.5X28 (Screw) ×3 IMPLANT
SCREW LP NL T15 3.5X22 (Screw) ×6 IMPLANT
SCREW LP NL T15 3.5X24 (Screw) ×6 IMPLANT
SCREW LP NL T15 3.5X26 (Screw) ×3 IMPLANT
SLEEVE MEASURING 3.2 (BIT) ×3 IMPLANT
SLING ARM IMMOBILIZER LRG (SOFTGOODS) ×3 IMPLANT
SPONGE LAP 18X18 X RAY DECT (DISPOSABLE) ×6 IMPLANT
SPONGE LAP 4X18 X RAY DECT (DISPOSABLE) ×6 IMPLANT
STAPLER VISISTAT 35W (STAPLE) IMPLANT
STOCKINETTE IMPERVIOUS 9X36 MD (GAUZE/BANDAGES/DRESSINGS) IMPLANT
STRIP CLOSURE SKIN 1/2X4 (GAUZE/BANDAGES/DRESSINGS) ×2 IMPLANT
SUCTION FRAZIER HANDLE 10FR (MISCELLANEOUS) ×2
SUCTION TUBE FRAZIER 10FR DISP (MISCELLANEOUS) ×1 IMPLANT
SUT ETHILON 3 0 PS 1 (SUTURE) ×3 IMPLANT
SUT MNCRL AB 3-0 PS2 18 (SUTURE) ×3 IMPLANT
SUT SILK 2 0 TIES 10X30 (SUTURE) ×3 IMPLANT
SUT VIC AB 1 CT1 27 (SUTURE) ×4
SUT VIC AB 1 CT1 27XBRD ANBCTR (SUTURE) ×2 IMPLANT
SUT VIC AB 2-0 CT1 27 (SUTURE) ×4
SUT VIC AB 2-0 CT1 TAPERPNT 27 (SUTURE) ×2 IMPLANT
SUT VIC AB 2-0 CTB1 (SUTURE) IMPLANT
TOWEL OR 17X24 6PK STRL BLUE (TOWEL DISPOSABLE) ×3 IMPLANT
TOWEL OR 17X26 10 PK STRL BLUE (TOWEL DISPOSABLE) ×3 IMPLANT
TUBE CONNECTING 12'X1/4 (SUCTIONS)
TUBE CONNECTING 12X1/4 (SUCTIONS) IMPLANT
WATER STERILE IRR 1000ML POUR (IV SOLUTION) ×3 IMPLANT
YANKAUER SUCT BULB TIP NO VENT (SUCTIONS) IMPLANT

## 2015-05-03 NOTE — Anesthesia Procedure Notes (Addendum)
Anesthesia Regional Block:  Interscalene brachial plexus block  Pre-Anesthetic Checklist: ,, timeout performed, Correct Patient, Correct Site, Correct Laterality, Correct Procedure, Correct Position, site marked, Risks and benefits discussed,  Surgical consent,  Pre-op evaluation,  At surgeon's request and post-op pain management  Laterality: Left  Prep: chloraprep       Needles:  Injection technique: Single-shot  Needle Type: Echogenic Stimulator Needle     Needle Length: 9cm 9 cm Needle Gauge: 21 and 21 G  Needle insertion depth: 3 cm   Additional Needles:  Procedures: ultrasound guided (picture in chart) Interscalene brachial plexus block Narrative:  Injection made incrementally with aspirations every 5 mL.  Events: injection painful  Performed by: Personally  Anesthesiologist: Mal AmabileFOSTER, MICHAEL  Additional Notes: Patient tolerated procedure well. Adequate sensory level.   Procedure Name: Intubation Date/Time: 05/03/2015 2:00 PM Performed by: Rosiland OzMEYERS, Keiarah Orlowski Pre-anesthesia Checklist: Patient identified, Emergency Drugs available, Suction available, Patient being monitored and Timeout performed Patient Re-evaluated:Patient Re-evaluated prior to inductionOxygen Delivery Method: Circle system utilized Preoxygenation: Pre-oxygenation with 100% oxygen Intubation Type: IV induction Laryngoscope Size: Miller and 2 Grade View: Grade I Tube type: Oral Tube size: 7.0 mm Number of attempts: 1 Airway Equipment and Method: Stylet Placement Confirmation: ETT inserted through vocal cords under direct vision,  positive ETCO2 and breath sounds checked- equal and bilateral Secured at: 21 cm Tube secured with: Tape Dental Injury: Teeth and Oropharynx as per pre-operative assessment

## 2015-05-03 NOTE — Brief Op Note (Signed)
05/03/2015  5:34 PM  PATIENT:  Cynthia Pace  55 y.o. female  PRE-OPERATIVE DIAGNOSIS:  left proximal humerus nonunion  POST-OPERATIVE DIAGNOSIS:  left proximal humerus nonunion  PROCEDURE:  Procedure(s): OPEN REDUCTION INTERNAL FIXATION (ORIF) LEFT PROXIMAL HUMERUS FRACTURE NONUNION  SURGEON:  Surgeon(s): Cammy CopaScott Tupac Jeffus, MD  ASSISTANT: Patrick Jupiterarla Bethune rnfa  ANESTHESIA:   general  EBL: 250 ml    Total I/O In: 1000 [I.V.:1000] Out: 250 [Blood:250]  BLOOD ADMINISTERED: none  DRAINS: none   LOCAL MEDICATIONS USED:  none  SPECIMEN:  No Specimen  COUNTS:  YES  TOURNIQUET:  * No tourniquets in log *  DICTATION: .Other Dictation: Dictation Number [id number]  PLAN OF CARE: Admit for overnight observation  PATIENT DISPOSITION:  PACU - hemodynamically stable

## 2015-05-03 NOTE — Op Note (Signed)
NAMESYERRA, Cynthia Pace NO.:  [id number]  MEDICAL RECORD NO.:  [id number]  LOCATION:  [id number]                        FACILITY:  [facility]  PHYSICIAN:  Burnard Bunting, M.D.    DATE OF BIRTH:  1961-02-17  DATE OF PROCEDURE:  05/03/2015 DATE OF DISCHARGE:                              OPERATIVE REPORT   PREOPERATIVE DIAGNOSIS:  Left proximal humerus fracture nonunion.  POSTOPERATIVE DIAGNOSIS:  Left proximal humerus fracture nonunion.  PROCEDURE:  Left proximal humerus fracture nonunion, open reduction and internal fixation with Biomet 11-hole plate.  SURGEON:  Burnard Bunting, M.D.  ASSISTANT:  Patrick Jupiter, RNFA.  INDICATION:  Cynthia Pace is a patient who is 7 weeks out proximal humerus fracture presents for operative management after explanation of risks and benefits.  She has established nonunion based on CT scanning as well as clinical exam.  She understands the risks, benefits, and wished to proceed with surgery.  PROCEDURE IN DETAIL:  The patient was brought to the operating room where general endotracheal anesthesia was induced. Preoperative antibiotics were administered.  Time-out was called.  Left shoulder was pre-scrubbed with alcohol and Betadine, allowed to air dry, prepped with DuraPrep solution and draped in sterile manner.  Collier Flowers was used to cover the entire operative field including axilla.  Time-out was called. Deltopectoral approach was made, extended distally along the midportion of the arm.  Skin and subcutaneous tissue were sharply divided.  The cephalic vein required ligation after mobilization.  The tissue was somewhat friable.  Deltopectoral interval was entered.  The fracture planes were identified.  The biceps tendon was identified.  The pec major insertion was attached to the fragment which was on the head. This fragment was tapered down and was a very long fragment extending down 9 cm from the medial calcar.  The underlying shaft piece  was also not attached.  Thorough irrigation and debridement around the fracture edges was performed.  Fibrous tissue was removed.  The head fragments were healed but the 2 main fragments which is the head on which a long lateral cortex piece was attached was not attached to the underlying shaft.  Following debridement through this fracture site, the fracture was reduced.  The axillary nerve was palpated and visualized and protected.  Good reduction was achieved and the plate was applied. Nonlocking screws were placed first in lag fashion through the upper portion of the fragment in the upper portion of the diaphysis.  The 2 screws were then placed distally which were nonlocking screws in order to adhere the plate to the bone.  Pegs were then placed within the head itself under fluoroscopic guidance in the AP and lateral planes.  The shaft was well centered under the head.  A second locking screw was then placed just above the lag screw for added fixation between the 2 main fragments.  Then, 10 cortices was achieved distally plus the locking screws placed in the proximal aspect of the head plus 1 nonlocking bicortical screw and a lag screw with proximal fixation.  This gave excellent stable fixation and range of motion.  Fluoroscopy was used to confirm screw lengths.  The care was taken to avoid injury  to the radial nerve as well with the screw length being checked under fluoroscopy.  At this time, thorough irrigation was performed about 4 L of irrigating solution.  The deltoid which had to be partially detached was then repaired using #1 Vicryl suture.  The rest of the incision was closed in interrupted fashion using 0 Vicryl suture, 2-0 Vicryl suture and 3-0 Monocryl.  Steri-Strips and an Aquacel dressing applied.  The patient tolerated the procedure well without immediate complication and placed in a bulky sling.     Burnard BuntingG. Scott Dean, M.D.     GSD/MEDQ  D:  05/03/2015  T:   05/03/2015  Job:  [id number]

## 2015-05-03 NOTE — Transfer of Care (Signed)
Immediate Anesthesia Transfer of Care Note  Patient: Cynthia Pace  Procedure(s) Performed: Procedure(s): OPEN REDUCTION INTERNAL FIXATION (ORIF) LEFT PROXIMAL HUMERUS FRACTURE NONUNION (Left)  Patient Location: PACU  Anesthesia Type:General  Level of Consciousness: awake, alert , oriented and patient cooperative  Airway & Oxygen Therapy: Patient Spontanous Breathing and Patient connected to nasal cannula oxygen  Post-op Assessment: Report given to RN, Post -op Vital signs reviewed and stable and Patient moving all extremities X 4  Post vital signs: Reviewed and stable  Last Vitals:  Filed Vitals:   05/03/15 1146  BP: 131/78  Pulse: 75  Temp: 36.9 C  Resp: 18    Complications: No apparent anesthesia complications

## 2015-05-03 NOTE — H&P (Signed)
Cynthia Pace is an 55 y.o. female.   Chief Complaint: Left shoulder pain HPI: Cynthia Pace is a superficial patient with left shoulder pain. She sustained an injury about 7 weeks ago to her left proximal humerus. An attempt at nonoperative management was made. She has failed that course of nonoperative treatment. CT scan demonstrates no bridging callus and no evidence of fracture. She is a smoker. She denies any other orthopedic complaints. 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 Hypertension   . GERD (gastroesophageal reflux disease)   . Environmental allergies   . Carpal tunnel syndrome   . Asthma   . Chronic kidney disease     kidney stones  . Seasonal allergies   . Arthritis   . Restless leg syndrome   . Elevated liver enzymes   . History of TMJ syndrome     Past Surgical History  Procedure Laterality Date  . Tonsillectomy    . Abdominal hysterectomy    . Ankle surgery Left     severed artery and tendons  . Appendectomy      Family History  Problem Relation Age of Onset  . Tuberculosis Mother   . Heart disease Mother   . Cancer Father    Social History:  reports that she has been smoking Cigarettes.  She has been smoking about 0.50 packs per day. She has never used smokeless tobacco. She reports that she drinks alcohol. She reports that she does not use illicit drugs.  Allergies:  Allergies  Allergen Reactions  . Pear Itching and Swelling    [place] pear trees  . Sulfa Antibiotics Rash    Medications Prior to Admission  Medication Sig Dispense Refill  . ADVAIR DISKUS 100-50 MCG/DOSE AEPB Inhale 1 puff into the lungs daily as needed (for allergies).   2  . albuterol (PROVENTIL HFA;VENTOLIN HFA) 108 (90 Base) MCG/ACT inhaler Inhale 2 puffs into the lungs every 6 (six) hours as needed for wheezing or shortness of breath.    . Cholecalciferol (VITAMIN  D) 2000 units tablet Take 2,000 Units by mouth daily.    . fluticasone (FLONASE) 50 MCG/ACT nasal spray Place 2 sprays into both nostrils daily as needed for allergies.   11  . hydrochlorothiazide (HYDRODIURIL) 25 MG tablet Take 25 mg by mouth daily.    Marland Kitchen HYDROcodone-acetaminophen (NORCO/VICODIN) 5-325 MG tablet Take 1 tablet by mouth every 6 (six) hours as needed for severe pain. (Patient taking differently: Take 1 tablet by mouth at bedtime. ) 15 tablet 0  . ibuprofen (ADVIL,MOTRIN) 200 MG tablet Take 400 mg by mouth daily as needed for moderate pain.    . Multiple Vitamin (MULTIVITAMIN WITH MINERALS) TABS tablet Take 1 tablet by mouth daily.    . pantoprazole (PROTONIX) 40 MG tablet Take 40 mg by mouth daily.  2  . potassium chloride (MICRO-K) 10 MEQ CR capsule Take 10 mEq by mouth daily. with food  0    Results for orders placed or performed during the hospital encounter of 05/03/15 (from the past 48 hour(s))  Comprehensive metabolic panel     Status: Abnormal   Collection Time: 05/03/15 12:05 PM  Result Value Ref Range   Sodium 144 135 - 145 mmol/L   Potassium 3.6 3.5 - 5.1 mmol/L   Chloride 110 101 - 111 mmol/L   CO2 21 (L) 22 - 32 mmol/L  Glucose, Bld 94 65 - 99 mg/dL   BUN 9 6 - 20 mg/dL   Creatinine, Ser 0.66 0.44 - 1.00 mg/dL   Calcium 10.3 8.9 - 10.3 mg/dL   Total Protein 7.0 6.5 - 8.1 g/dL   Albumin 4.2 3.5 - 5.0 g/dL   AST 30 15 - 41 U/L   ALT 24 14 - 54 U/L   Alkaline Phosphatase 74 38 - 126 U/L   Total Bilirubin 0.7 0.3 - 1.2 mg/dL   GFR calc non Af Amer >60 >60 mL/min   GFR calc Af Amer >60 >60 mL/min    Comment: (NOTE) The eGFR has been calculated using the CKD EPI equation. This calculation has not been validated in all clinical situations. eGFR's persistently <60 mL/min signify possible Chronic Kidney Disease.    Anion gap 13 5 - 15  CBC     Status: None   Collection Time: 05/03/15 12:05 PM  Result Value Ref Range   WBC 9.2 4.0 - 10.5 K/uL   RBC 4.05 3.87  - 5.11 MIL/uL   Hemoglobin 13.4 12.0 - 15.0 g/dL   HCT 39.2 36.0 - 46.0 %   MCV 96.8 78.0 - 100.0 fL   MCH 33.1 26.0 - 34.0 pg   MCHC 34.2 30.0 - 36.0 g/dL   RDW 13.2 11.5 - 15.5 %   Platelets 284 150 - 400 K/uL   Ct Shoulder Left Wo Contrast  05/02/2015  CLINICAL DATA:  Left shoulder pain. EXAM: CT OF THE LEFT SHOULDER WITHOUT CONTRAST TECHNIQUE: Multidetector CT imaging was performed according to the standard protocol. Multiplanar CT image reconstructions were also generated. COMPARISON:  03/04/2015 FINDINGS: Comminuted proximal Proximal humeral fracture with an oblique component partially including the surgical neck medially, and and longitudinal component extending 9.7 cm along the proximal shaft. The comminution results an a fracture including the humeral head and most of the surgical neck region ; a medial fragment measuring 3.4 cm in length on image 58 series 8, mildly rotated so that its inferior component is medial ; a longitudinal fragment measuring 10.3 cm laterally; and the dominant shaft for fragment extending distally. Along the longitudinal fragment, there is up to 1.0 cm distraction distally, with the more anterior and smaller fragment also displaced 12 mm laterally in its distal portion. No scapular, clavicular, or regional rib fracture is identified. As expected there is a glenohumeral joint effusion. IMPRESSION: 1. Comminuted proximal humeral fracture has an oblique component that includes part of the surgical neck; a small intermediary fragment laterally ; and a longitudinal fracture which extends 9.7 cm along the proximal shaft as described above. Electronically Signed   By: Van Clines M.D.   On: 05/02/2015 08:27    Review of Systems  Constitutional: Negative.   HENT: Negative.   Eyes: Negative.   Respiratory: Negative.   Cardiovascular: Negative.   Gastrointestinal: Negative.   Genitourinary: Negative.   Musculoskeletal: Positive for joint pain.  Skin: Negative.    Neurological: Negative.   Endo/Heme/Allergies: Negative.   Psychiatric/Behavioral: Negative.     Blood pressure 131/78, pulse 75, temperature 98.4 F (36.9 C), temperature source Oral, resp. rate 18, height 5' 3.75" (1.619 m), weight 62.143 kg (137 lb), SpO2 100 %. Physical Exam  Constitutional: She appears well-developed.  HENT:  Head: Normocephalic.  Eyes: Pupils are equal, round, and reactive to light.  Neck: Normal range of motion.  Cardiovascular: Normal rate.   Respiratory: Effort normal.  Neurological: She is alert.  Skin: Skin is warm.  Psychiatric: She has a normal mood and affect.  Examination of the left shoulder demonstrates significant movement and crepitus at the fracture site. There is no movement of the humerus as a single unit. She has nonfunctional abduction although her deltoid fires. Motor sensory function to the hand is intact radial pulses intact   Assessment/Plan Impression is nonunion left proximal humerus fracture due to in some parts smoking as well as potentially other metabolic factors plan open reduction internal fixation with bone grafting of the proximal humerus fracture. Plan is to obtain stable enough fixation so that we can begin shoulder range of motion. Risks and benefits discussed with the patient including but limited to infection or vessel damage potential for further nonunion malunion and need for further surgery including hardware removal. I think it is going to take her on the order of 6-9 months to regain full shoulder function. She may not regain full range of motion particularly overhead with this particular course of treatment. I think that she will not heal without surgical intervention. All questions answered  Meredith Pel, MD 05/03/2015, 1:31 PM

## 2015-05-03 NOTE — Anesthesia Postprocedure Evaluation (Signed)
Anesthesia Post Note  Patient: Cynthia Pace  Procedure(s) Performed: Procedure(s) (LRB): OPEN REDUCTION INTERNAL FIXATION (ORIF) LEFT PROXIMAL HUMERUS FRACTURE NONUNION (Left)  Patient location during evaluation: PACU Anesthesia Type: General Level of consciousness: sedated Pain management: pain level controlled Vital Signs Assessment: post-procedure vital signs reviewed and stable Respiratory status: spontaneous breathing and respiratory function stable Cardiovascular status: stable Anesthetic complications: no    Last Vitals:  Filed Vitals:   05/03/15 1854 05/03/15 1938  BP: 114/69 107/68  Pulse: 73 74  Temp: 36.4 C 36.6 C  Resp: 16 16    Last Pain:  Filed Vitals:   05/03/15 2120  PainSc: 6                  Javeon Macmurray DANIEL

## 2015-05-03 NOTE — Anesthesia Preprocedure Evaluation (Addendum)
Anesthesia Evaluation  Patient identified by MRN, date of birth, ID band Patient awake    Reviewed: Allergy & Precautions, NPO status , Patient's Chart, lab work & pertinent test results  Airway Mallampati: II  TM Distance: >3 FB Neck ROM: Full    Dental no notable dental hx.    Pulmonary asthma , Current Smoker,    Pulmonary exam normal breath sounds clear to auscultation       Cardiovascular hypertension, Pt. on medications Normal cardiovascular exam Rhythm:Regular Rate:Normal     Neuro/Psych Restless legs syndrome  Neuromuscular disease negative psych ROS   GI/Hepatic GERD  Medicated and Controlled,Hx/o elevated LFT's   Endo/Other    Renal/GU Renal InsufficiencyRenal disease  negative genitourinary   Musculoskeletal  (+) Arthritis , Non union Fx left proximal Humerus   Abdominal   Peds  Hematology   Anesthesia Other Findings   Reproductive/Obstetrics                            Anesthesia Physical Anesthesia Plan  ASA: III  Anesthesia Plan: General   Post-op Pain Management: GA combined w/ Regional for post-op pain   Induction: Intravenous  Airway Management Planned: Oral ETT  Additional Equipment:   Intra-op Plan:   Post-operative Plan: Extubation in OR  Informed Consent: I have reviewed the patients History and Physical, chart, labs and discussed the procedure including the risks, benefits and alternatives for the proposed anesthesia with the patient or authorized representative who has indicated his/her understanding and acceptance.   Dental advisory given  Plan Discussed with: CRNA, Anesthesiologist and Surgeon  Anesthesia Plan Comments:         Anesthesia Quick Evaluation

## 2015-05-04 ENCOUNTER — Encounter (HOSPITAL_COMMUNITY): Payer: Self-pay | Admitting: General Practice

## 2015-05-04 MED ORDER — METHOCARBAMOL 500 MG PO TABS: 500.0000 mg | ORAL_TABLET | Freq: Four times a day (QID) | ORAL | Status: DC | PRN

## 2015-05-04 MED ORDER — OXYCODONE HCL 5 MG PO TABS: 5.0000 mg | ORAL_TABLET | ORAL | Status: DC | PRN

## 2015-05-04 MED ORDER — OXYCODONE HCL ER 10 MG PO T12A
10.0000 mg | EXTENDED_RELEASE_TABLET | Freq: Two times a day (BID) | ORAL | Status: DC
  Administered 2015-05-04 – 2015-05-05 (×3): 10 mg via ORAL
  Filled 2015-05-04 (×3): qty 1

## 2015-05-04 MED ORDER — OXYCODONE HCL ER 10 MG PO T12A: 10.0000 mg | EXTENDED_RELEASE_TABLET | Freq: Two times a day (BID) | ORAL | Status: DC

## 2015-05-04 MED ORDER — HYDROMORPHONE HCL 1 MG/ML IJ SOLN
1.0000 mg | INTRAMUSCULAR | Status: DC | PRN
  Administered 2015-05-04: 1 mg via INTRAVENOUS
  Filled 2015-05-04: qty 1

## 2015-05-04 MED ORDER — DOCUSATE SODIUM 100 MG PO CAPS: 100.0000 mg | ORAL_CAPSULE | Freq: Two times a day (BID) | ORAL | Status: DC

## 2015-05-04 NOTE — Progress Notes (Signed)
Subjective: Pt stable but having pain   Objective: Vital signs in last 24 hours: Temp:  [97.5 F (36.4 C)-98.4 F (36.9 C)] 98.1 F (36.7 C) (03/10 0500) Pulse Rate:  [71-99] 88 (03/10 0500) Resp:  [9-22] 16 (03/10 0500) BP: (96-131)/(54-91) 119/72 mmHg (03/10 0500) SpO2:  [89 %-100 %] 92 % (03/10 0935) Weight:  [62.143 kg (137 lb)] 62.143 kg (137 lb) (03/09 1146)  Intake/Output from previous day: 03/09 0701 - 03/10 0700 In: 1120 [P.O.:120; I.V.:1000] Out: 250 [Blood:250] Intake/Output this shift: Total I/O In: 120 [P.O.:120] Out: -   Exam:  Neurovascular intact Sensation intact distally  Labs:  Recent Labs  05/03/15 1205  HGB 13.4    Recent Labs  05/03/15 1205  WBC 9.2  RBC 4.05  HCT 39.2  PLT 284    Recent Labs  05/03/15 1205  NA 144  K 3.6  CL 110  CO2 21*  BUN 9  CREATININE 0.66  GLUCOSE 94  CALCIUM 10.3   No results for input(s): LABPT, INR in the last 72 hours.  Assessment/Plan: Plan to keep today - try long and short acting oxy with dilaudid for breakthrough - dc am   DEAN,GREGORY SCOTT 05/04/2015, 11:11 AM

## 2015-05-04 NOTE — Progress Notes (Signed)
Occupational Therapy Evaluation Patient Details Name: Cynthia Pace MRN: [id number] DOB: 05/08/1960 Today's Date: 05/04/2015    History of Present Illness s/p fall @ 7 weeks ago. Underwent ORIF L humerus fx due to nonhealing fx.    Clinical Impression   PTA, pt had begun to complete ADL @ mod I level after sustaining her shoulder fx 7 weeks ago. Currently pt @ mod A with ADL and mod I with mobility. Limited by pain. Participated in limited PROM LUE with seated pendulums and dangling in standing. Began education on compensatory techniques for UB ADL. Written information given. Will see in am prior to D/C. Pt will need to follow up with outpt therapy as progressed by Dr. August Saucerean.     Follow Up Recommendations  Other (comment);Supervision - Intermittent (outpt OT when appropriate)    Equipment Recommendations  None recommended by OT    Recommendations for Other Services       Precautions / Restrictions Precautions Precautions: Other (comment) (NWB; PROM) Required Braces or Orthoses: Sling Restrictions Weight Bearing Restrictions: Yes LUE Weight Bearing: Non weight bearing      Mobility Bed Mobility Overal bed mobility: Needs Assistance             General bed mobility comments: vc to roll to nonaffected side.  Transfers Overall transfer level: Modified independent                    Balance Overall balance assessment: No apparent balance deficits (not formally assessed)                                          ADL Overall ADL's : Needs assistance/impaired Eating/Feeding: Set up;Sitting   Grooming: Moderate assistance;Sitting   Upper Body Bathing: Moderate assistance;Sitting   Lower Body Bathing: Minimal assistance;Sit to/from stand   Upper Body Dressing : Maximal assistance;Sitting   Lower Body Dressing: Minimal assistance   Toilet Transfer: Modified Independent           Functional mobility during ADLs: Modified  independent General ADL Comments: Began education regarding compensatory techniques for ADL. Pt has been dealing with lmited use of LUE, but she states pain is "very limiting" at this time. Husband in at end of session. REviewed techniques with him. Husband will be out ot town at times adn pt will need to be able to donn/doff sling and complete simple ADL at mod I level.      Vision     Perception     Praxis      Pertinent Vitals/Pain Pain Assessment: 0-10 Pain Score: 9  Pain Location: L UE Pain Descriptors / Indicators: Aching;Burning;Sharp;Shooting Pain Intervention(s): Limited activity within patient's tolerance;Ice applied;Repositioned     Hand Dominance Right   Extremity/Trunk Assessment Upper Extremity Assessment Upper Extremity Assessment: LUE deficits/detail LUE Deficits / Details: limited by pain. Unable to fully extend elbow  - lacking @ 30 degrees. Able to flex/ext @ 30 total at elbow using other arm to assist. Completed pendulums in sitting. Stood to dangle. All within her pain tolerance. min edema L hand LUE Coordination: decreased fine motor;decreased gross motor   Lower Extremity Assessment Lower Extremity Assessment: Overall WFL for tasks assessed   Cervical / Trunk Assessment Cervical / Trunk Assessment: Normal   Communication     Cognition Arousal/Alertness: Lethargic;Suspect due to medications Behavior During Therapy: Agitated Overall Cognitive Status: Within Functional Limits  for tasks assessed                     General Comments       Exercises Exercises: Shoulder     Shoulder Instructions Shoulder Instructions Donning/doffing shirt without moving shoulder: Maximal assistance Method for sponge bathing under operated UE: Moderate assistance Donning/doffing sling/immobilizer: Maximal assistance Correct positioning of sling/immobilizer: Moderate assistance Pendulum exercises (written home exercise program): Minimal assistance ROM for  elbow, wrist and digits of operated UE: Minimal assistance Sling wearing schedule (on at all times/off for ADL's): Supervision/safety Proper positioning of operated UE when showering: Minimal assistance Positioning of UE while sleeping: Minimal assistance    Home Living Family/patient expects to be discharged to:: Private residence Living Arrangements: Spouse/significant other Available Help at Discharge: Available PRN/intermittently;Friend(s) Type of Home: House Home Access: Stairs to enter Secretary/administrator of Steps: 1   Home Layout: Two level;Bed/bath upstairs Alternate Level Stairs-Number of Steps: flights Alternate Level Stairs-Rails: Left Bathroom Shower/Tub: Producer, television/film/video: Handicapped height Bathroom Accessibility: Yes How Accessible: Accessible via walker Home Equipment: Walker - 2 wheels (has grab ar but not installed)          Prior Functioning/Environment Level of Independence: Independent        Comments: Had just gotten to the point of bathing and dressing self prior to surgery. Avid golfer.     OT Diagnosis: Generalized weakness;Acute pain   OT Problem List: Decreased strength;Decreased range of motion;Decreased coordination;Decreased safety awareness;Decreased knowledge of precautions;Pain   OT Treatment/Interventions: Self-care/ADL training;Therapeutic exercise;Therapeutic activities;DME and/or AE instruction;Patient/family education    OT Goals(Current goals can be found in the care plan section) Acute Rehab OT Goals Patient Stated Goal: to not be in pain OT Goal Formulation: With patient Time For Goal Achievement: 05/11/15 Potential to Achieve Goals: Good ADL Goals Pt Will Perform Upper Body Bathing: with caregiver independent in assisting;sitting;with supervision Pt Will Perform Upper Body Dressing: with supervision;with caregiver independent in assisting;sitting Pt/caregiver will Perform Home Exercise Program: Increased  ROM;Left upper extremity;With written HEP provided;With minimal assist (PROM LUE;pendulums) Additional ADL Goal #1: Pt/caregiver will be independent in donning/doffing sling LUE Additional ADL Goal #2: Pt/caregiver will be independent in positioning LUE in sitting/standing.  OT Frequency: Min 2X/week   Barriers to D/C:            Co-evaluation              End of Session Nurse Communication: Mobility status;Patient requests pain meds;Precautions;Weight bearing status  Activity Tolerance: Patient limited by pain Patient left: in bed;with call bell/phone within reach;with family/visitor present   Time: 1610-9604 OT Time Calculation (min): 34 min Charges:  OT General Charges $OT Visit: 1 Procedure OT Evaluation $OT Eval Moderate Complexity: 1 Procedure G-Codes: OT G-codes **NOT FOR INPATIENT CLASS** Functional Assessment Tool Used: clinical judgement Functional Limitation: Self care Self Care Current Status (V4098): At least 40 percent but less than 60 percent impaired, limited or restricted Self Care Goal Status (J1914): At least 1 percent but less than 20 percent impaired, limited or restricted  Meiko Stranahan,HILLARY 05/04/2015, 5:42 PM   [facility], OTR/L  [phone] 05/04/2015

## 2015-05-04 NOTE — Progress Notes (Signed)
   05/04/15 1500  Clinical Encounter Type  Visited With Patient and family together  Visit Type Initial;Psychological support;Spiritual support;Social support  Referral From Nurse  Consult/Referral To Chaplain  Spiritual Encounters  Spiritual Needs Prayer;Emotional  Stress Factors  Patient Stress Factors Exhausted;Family relationships;Health changes   Chaplain stopped by to visit with Pt. Chaplain provided emotional care via prayer. Please page chaplain if Pt. Needs more support  Thanks Chaplain Corwin LevinsBeatrice Athalia Pace

## 2015-05-04 NOTE — Progress Notes (Signed)
OT Cancellation Note  Patient Details Name: Sandre KittyCheryl A Kunesh MRN: [id number] DOB: 03/06/1960   Cancelled Treatment:    Reason Eval/Treat Not Completed: Pain limiting ability to participate Attempted to see. Pt crying in pain. Nsg notified. Will return later when pain better controlled.  [facility] Rehab CenterWARD,HILLARY  Dyani Babel, OTR/L  [phone] 05/04/2015 05/04/2015, 12:08 PM

## 2015-05-05 NOTE — Progress Notes (Signed)
Subjective: 2 Days Post-Op Procedure(s) (LRB): OPEN REDUCTION INTERNAL FIXATION (ORIF) LEFT PROXIMAL HUMERUS FRACTURE NONUNION (Left) Patient reports pain as moderate.  Appears comfortable sitting on edge of bed.  Objective: Vital signs in last 24 hours: Temp:  [98.6 F (37 C)-99.8 F (37.7 C)] 99.8 F (37.7 C) (03/11 0404) Pulse Rate:  [86-95] 95 (03/11 0404) Resp:  [18] 18 (03/11 0404) BP: (121-132)/(62-78) 121/62 mmHg (03/11 0404) SpO2:  [92 %-97 %] 94 % (03/11 0841)  Intake/Output from previous day: 03/10 0701 - 03/11 0700 In: 720 [P.O.:720] Out: -  Intake/Output this shift:     Recent Labs  05/03/15 1205  HGB 13.4    Recent Labs  05/03/15 1205  WBC 9.2  RBC 4.05  HCT 39.2  PLT 284    Recent Labs  05/03/15 1205  NA 144  K 3.6  CL 110  CO2 21*  BUN 9  CREATININE 0.66  GLUCOSE 94  CALCIUM 10.3   No results for input(s): LABPT, INR in the last 72 hours.  Sensation intact distally  Left hand full motor  Dressing left arm clean dry and inatct  Assessment/Plan: 2 Days Post-Op Procedure(s) (LRB): OPEN REDUCTION INTERNAL FIXATION (ORIF) LEFT PROXIMAL HUMERUS FRACTURE NONUNION (Left) Discharge home with home health  Follow up with Dr. August Saucerean in 2 weeks  Cynthia Pace, Cynthia Pace 05/05/2015, 8:56 AM

## 2015-05-05 NOTE — Progress Notes (Signed)
Discharge instructions given. Pt verbalized understanding and all questions were answered.  

## 2015-05-05 NOTE — Discharge Summary (Signed)
Patient ID: Cynthia Pace MRN: [id number] DOB/AGE: Apr 26, 1960 55 y.o.  Admit date: 05/03/2015 Discharge date: 05/05/2015  Admission Diagnoses:  Active Problems:   Shoulder fracture, left   Discharge Diagnoses:  Same  Past Medical History  Diagnosis Date  . Hypertension   . GERD (gastroesophageal reflux disease)   . Environmental allergies   . Carpal tunnel syndrome   . Asthma   . Seasonal allergies   . Restless leg syndrome   . Elevated liver enzymes   . History of TMJ syndrome   . Family history of adverse reaction to anesthesia     "daughter died after 2nd epidural for C-section; they were able to bring her back"  . Hyperlipidemia   . Chronic bronchitis (HCC)   . Arthritis     "left ankle" (05/04/2015)  . Chronic lower back pain   . Kidney stones     Surgeries: Procedure(s): OPEN REDUCTION INTERNAL FIXATION (ORIF) LEFT PROXIMAL HUMERUS FRACTURE NONUNION on 05/03/2015   Consultants:    Discharged Condition: Improved  Hospital Course: Cynthia Pace is an 55 y.o. female who was admitted 05/03/2015 for operative treatment of<principal problem not specified>. Patient has severe unremitting pain that affects sleep, daily activities, and work/hobbies. After pre-op clearance the patient was taken to the operating room on 05/03/2015 and underwent  Procedure(s): OPEN REDUCTION INTERNAL FIXATION (ORIF) LEFT PROXIMAL HUMERUS FRACTURE NONUNION.    Patient was given perioperative antibiotics: Anti-infectives    Start     Dose/Rate Route Frequency Ordered Stop   05/03/15 2030  ceFAZolin (ANCEF) IVPB 2 g/50 mL premix     2 g 100 mL/hr over 30 Minutes Intravenous Every 6 hours 05/03/15 1913 05/04/15 0402   05/03/15 1130  ceFAZolin (ANCEF) IVPB 2 g/50 mL premix     2 g 100 mL/hr over 30 Minutes Intravenous On call to O.R. 05/03/15 1128 05/03/15 1402       Patient was given sequential compression devices, early ambulation, and chemoprophylaxis to prevent DVT.  Patient benefited  maximally from hospital stay and there were no complications.    Recent vital signs: Patient Vitals for the past 24 hrs:  BP Temp Temp src Pulse Resp SpO2  05/05/15 0841 - - - - - 94 %  05/05/15 0404 121/62 mmHg 99.8 F (37.7 C) Oral 95 18 95 %  05/04/15 1941 123/78 mmHg 99.3 F (37.4 C) Oral 91 18 96 %  05/04/15 1251 132/67 mmHg 98.6 F (37 C) - 86 18 97 %  05/04/15 0935 - - - - - 92 %     Recent laboratory studies:  Recent Labs  05/03/15 1205  WBC 9.2  HGB 13.4  HCT 39.2  PLT 284  NA 144  K 3.6  CL 110  CO2 21*  BUN 9  CREATININE 0.66  GLUCOSE 94  CALCIUM 10.3     Discharge Medications:     Medication List    STOP taking these medications        HYDROcodone-acetaminophen 5-325 MG tablet  Commonly known as:  NORCO/VICODIN      TAKE these medications        ADVAIR DISKUS 100-50 MCG/DOSE Aepb  Generic drug:  Fluticasone-Salmeterol  Inhale 1 puff into the lungs daily as needed (for allergies).     albuterol 108 (90 Base) MCG/ACT inhaler  Commonly known as:  PROVENTIL HFA;VENTOLIN HFA  Inhale 2 puffs into the lungs every 6 (six) hours as needed for wheezing or shortness of breath.  docusate sodium 100 MG capsule  Commonly known as:  COLACE  Take 1 capsule (100 mg total) by mouth 2 (two) times daily.     fluticasone 50 MCG/ACT nasal spray  Commonly known as:  FLONASE  Place 2 sprays into both nostrils daily as needed for allergies.     hydrochlorothiazide 25 MG tablet  Commonly known as:  HYDRODIURIL  Take 25 mg by mouth daily.     ibuprofen 200 MG tablet  Commonly known as:  ADVIL,MOTRIN  Take 400 mg by mouth daily as needed for moderate pain.     methocarbamol 500 MG tablet  Commonly known as:  ROBAXIN  Take 1 tablet (500 mg total) by mouth every 6 (six) hours as needed for muscle spasms.     multivitamin with minerals Tabs tablet  Take 1 tablet by mouth daily.     oxyCODONE 5 MG immediate release tablet  Commonly known as:  Oxy  IR/ROXICODONE  Take 1-2 tablets (5-10 mg total) by mouth every 3 (three) hours as needed for breakthrough pain.     oxyCODONE 10 mg 12 hr tablet  Commonly known as:  OXYCONTIN  Take 1 tablet (10 mg total) by mouth every 12 (twelve) hours.     pantoprazole 40 MG tablet  Commonly known as:  PROTONIX  Take 40 mg by mouth daily.     potassium chloride 10 MEQ CR capsule  Commonly known as:  MICRO-K  Take 10 mEq by mouth daily. with food     Vitamin D 2000 units tablet  Take 2,000 Units by mouth daily.        Diagnostic Studies: Ct Shoulder Left Wo Contrast  05/02/2015  CLINICAL DATA:  Left shoulder pain. EXAM: CT OF THE LEFT SHOULDER WITHOUT CONTRAST TECHNIQUE: Multidetector CT imaging was performed according to the standard protocol. Multiplanar CT image reconstructions were also generated. COMPARISON:  03/04/2015 FINDINGS: Comminuted proximal Proximal humeral fracture with an oblique component partially including the surgical neck medially, and and longitudinal component extending 9.7 cm along the proximal shaft. The comminution results an a fracture including the humeral head and most of the surgical neck region ; a medial fragment measuring 3.4 cm in length on image 58 series 8, mildly rotated so that its inferior component is medial ; a longitudinal fragment measuring 10.3 cm laterally; and the dominant shaft for fragment extending distally. Along the longitudinal fragment, there is up to 1.0 cm distraction distally, with the more anterior and smaller fragment also displaced 12 mm laterally in its distal portion. No scapular, clavicular, or regional rib fracture is identified. As expected there is a glenohumeral joint effusion. IMPRESSION: 1. Comminuted proximal humeral fracture has an oblique component that includes part of the surgical neck; a small intermediary fragment laterally ; and a longitudinal fracture which extends 9.7 cm along the proximal shaft as described above. Electronically  Signed   By: Gaylyn RongWalter  Liebkemann M.D.   On: 05/02/2015 08:27   Dg Humerus Left  05/03/2015  CLINICAL DATA:  Operative imaging during ORIF of a proximal left humerus fracture. EXAM: LEFT HUMERUS - 2+ VIEW; DG C-ARM 61-120 MIN COMPARISON:  None. FINDINGS: For submitted images show placement of a lateral fixation plate and multiple screws reducing the major fracture components into near anatomic alignment. There is no new fracture or evidence of an operative complication. IMPRESSION: Well aligned major fracture components following ORIF of the proximal left humerus fracture. Electronically Signed   By: Amie Portlandavid  Ormond M.D.   On:  05/03/2015 16:54   Dg C-arm 61-120 Min  05/03/2015  CLINICAL DATA:  Operative imaging during ORIF of a proximal left humerus fracture. EXAM: LEFT HUMERUS - 2+ VIEW; DG C-ARM 61-120 MIN COMPARISON:  None. FINDINGS: For submitted images show placement of a lateral fixation plate and multiple screws reducing the major fracture components into near anatomic alignment. There is no new fracture or evidence of an operative complication. IMPRESSION: Well aligned major fracture components following ORIF of the proximal left humerus fracture. Electronically Signed   By: Amie Portland M.D.   On: 05/03/2015 16:54    Disposition: 01-Home or Self Care      Discharge Instructions    Call MD / Call 911    Complete by:  As directed   If you experience chest pain or shortness of breath, CALL 911 and be transported to the hospital emergency room.  If you develope a fever above 101 F, pus (white drainage) or increased drainage or redness at the wound, or calf pain, call your surgeon's office.     Constipation Prevention    Complete by:  As directed   Drink plenty of fluids.  Prune juice may be helpful.  You may use a stool softener, such as Colace (over the counter) 100 mg twice a day.  Use MiraLax (over the counter) for constipation as needed.     Diet - low sodium heart healthy    Complete by:  As  directed      Discharge instructions    Complete by:  As directed   OK to do pendulums for exercise 3 times a day No lifting with left arm Keep incision dry     Discharge patient    Complete by:  As directed      Increase activity slowly as tolerated    Complete by:  As directed            Follow-up Information    Follow up with Cammy Copa, MD. Schedule an appointment as soon as possible for a visit in 20 weeks.   Specialty:  Orthopedic Surgery   Contact information:   [address] [place] [address] [phone]        Signed: Kathryne Hitch 05/05/2015, 8:59 AM

## 2015-05-05 NOTE — Progress Notes (Signed)
Patient ID: Cynthia Pace, female   DOB: 06/13/1960, 55 y.o.   MRN: [id number] Pain control much better.  Left humerus dressing clean and dry.  Left hand well-perfused with normal sensation.  Can be discharged to home today.

## 2015-05-05 NOTE — Discharge Summary (Signed)
Patient ID: Cynthia Pace MRN: [id number] DOB/AGE: 1960/10/27 55 y.o.  Admit date: 05/03/2015 Discharge date: 05/05/2015  Admission Diagnoses:  Active Problems:   Shoulder fracture, left   Discharge Diagnoses:  Same  Past Medical History  Diagnosis Date  . Hypertension   . GERD (gastroesophageal reflux disease)   . Environmental allergies   . Carpal tunnel syndrome   . Asthma   . Seasonal allergies   . Restless leg syndrome   . Elevated liver enzymes   . History of TMJ syndrome   . Family history of adverse reaction to anesthesia     "daughter died after 2nd epidural for C-section; they were able to bring her back"  . Hyperlipidemia   . Chronic bronchitis (HCC)   . Arthritis     "left ankle" (05/04/2015)  . Chronic lower back pain   . Kidney stones     Surgeries: Procedure(s): OPEN REDUCTION INTERNAL FIXATION (ORIF) LEFT PROXIMAL HUMERUS FRACTURE NONUNION on 05/03/2015   Consultants:  PT/OT  Discharged Condition: Improved  Hospital Course: Cynthia Pace is an 55 y.o. female who was admitted 05/03/2015 for operative treatment of<principal problem not specified>. Patient has severe unremitting pain that affects sleep, daily activities, and work/hobbies. After pre-op clearance the patient was taken to the operating room on 05/03/2015 and underwent  Procedure(s): OPEN REDUCTION INTERNAL FIXATION (ORIF) LEFT PROXIMAL HUMERUS FRACTURE NONUNION.    Patient was given perioperative antibiotics: Anti-infectives    Start     Dose/Rate Route Frequency Ordered Stop   05/03/15 2030  ceFAZolin (ANCEF) IVPB 2 g/50 mL premix     2 g 100 mL/hr over 30 Minutes Intravenous Every 6 hours 05/03/15 1913 05/04/15 0402   05/03/15 1130  ceFAZolin (ANCEF) IVPB 2 g/50 mL premix     2 g 100 mL/hr over 30 Minutes Intravenous On call to O.R. 05/03/15 1128 05/03/15 1402       Patient was given sequential compression devices, early ambulation, and chemoprophylaxis to prevent DVT.  Patient  benefited maximally from hospital stay and there were no complications.    Recent vital signs: Patient Vitals for the past 24 hrs:  BP Temp Temp src Pulse Resp SpO2  05/05/15 0841 - - - - - 94 %  05/05/15 0404 121/62 mmHg 99.8 F (37.7 C) Oral 95 18 95 %  05/04/15 1941 123/78 mmHg 99.3 F (37.4 C) Oral 91 18 96 %  05/04/15 1251 132/67 mmHg 98.6 F (37 C) - 86 18 97 %  05/04/15 0935 - - - - - 92 %     Recent laboratory studies:  Recent Labs  05/03/15 1205  WBC 9.2  HGB 13.4  HCT 39.2  PLT 284  NA 144  K 3.6  CL 110  CO2 21*  BUN 9  CREATININE 0.66  GLUCOSE 94  CALCIUM 10.3     Discharge Medications:     Medication List    STOP taking these medications        HYDROcodone-acetaminophen 5-325 MG tablet  Commonly known as:  NORCO/VICODIN      TAKE these medications        ADVAIR DISKUS 100-50 MCG/DOSE Aepb  Generic drug:  Fluticasone-Salmeterol  Inhale 1 puff into the lungs daily as needed (for allergies).     albuterol 108 (90 Base) MCG/ACT inhaler  Commonly known as:  PROVENTIL HFA;VENTOLIN HFA  Inhale 2 puffs into the lungs every 6 (six) hours as needed for wheezing or shortness of breath.  docusate sodium 100 MG capsule  Commonly known as:  COLACE  Take 1 capsule (100 mg total) by mouth 2 (two) times daily.     fluticasone 50 MCG/ACT nasal spray  Commonly known as:  FLONASE  Place 2 sprays into both nostrils daily as needed for allergies.     hydrochlorothiazide 25 MG tablet  Commonly known as:  HYDRODIURIL  Take 25 mg by mouth daily.     ibuprofen 200 MG tablet  Commonly known as:  ADVIL,MOTRIN  Take 400 mg by mouth daily as needed for moderate pain.     methocarbamol 500 MG tablet  Commonly known as:  ROBAXIN  Take 1 tablet (500 mg total) by mouth every 6 (six) hours as needed for muscle spasms.     multivitamin with minerals Tabs tablet  Take 1 tablet by mouth daily.     oxyCODONE 5 MG immediate release tablet  Commonly known as:  Oxy  IR/ROXICODONE  Take 1-2 tablets (5-10 mg total) by mouth every 3 (three) hours as needed for breakthrough pain.     oxyCODONE 10 mg 12 hr tablet  Commonly known as:  OXYCONTIN  Take 1 tablet (10 mg total) by mouth every 12 (twelve) hours.     pantoprazole 40 MG tablet  Commonly known as:  PROTONIX  Take 40 mg by mouth daily.     potassium chloride 10 MEQ CR capsule  Commonly known as:  MICRO-K  Take 10 mEq by mouth daily. with food     Vitamin D 2000 units tablet  Take 2,000 Units by mouth daily.        Diagnostic Studies: Ct Shoulder Left Wo Contrast  05/02/2015  CLINICAL DATA:  Left shoulder pain. EXAM: CT OF THE LEFT SHOULDER WITHOUT CONTRAST TECHNIQUE: Multidetector CT imaging was performed according to the standard protocol. Multiplanar CT image reconstructions were also generated. COMPARISON:  03/04/2015 FINDINGS: Comminuted proximal Proximal humeral fracture with an oblique component partially including the surgical neck medially, and and longitudinal component extending 9.7 cm along the proximal shaft. The comminution results an a fracture including the humeral head and most of the surgical neck region ; a medial fragment measuring 3.4 cm in length on image 58 series 8, mildly rotated so that its inferior component is medial ; a longitudinal fragment measuring 10.3 cm laterally; and the dominant shaft for fragment extending distally. Along the longitudinal fragment, there is up to 1.0 cm distraction distally, with the more anterior and smaller fragment also displaced 12 mm laterally in its distal portion. No scapular, clavicular, or regional rib fracture is identified. As expected there is a glenohumeral joint effusion. IMPRESSION: 1. Comminuted proximal humeral fracture has an oblique component that includes part of the surgical neck; a small intermediary fragment laterally ; and a longitudinal fracture which extends 9.7 cm along the proximal shaft as described above. Electronically  Signed   By: Gaylyn RongWalter  Liebkemann M.D.   On: 05/02/2015 08:27   Dg Humerus Left  05/03/2015  CLINICAL DATA:  Operative imaging during ORIF of a proximal left humerus fracture. EXAM: LEFT HUMERUS - 2+ VIEW; DG C-ARM 61-120 MIN COMPARISON:  None. FINDINGS: For submitted images show placement of a lateral fixation plate and multiple screws reducing the major fracture components into near anatomic alignment. There is no new fracture or evidence of an operative complication. IMPRESSION: Well aligned major fracture components following ORIF of the proximal left humerus fracture. Electronically Signed   By: Amie Portlandavid  Ormond M.D.   On:  05/03/2015 16:54   Dg C-arm 61-120 Min  05/03/2015  CLINICAL DATA:  Operative imaging during ORIF of a proximal left humerus fracture. EXAM: LEFT HUMERUS - 2+ VIEW; DG C-ARM 61-120 MIN COMPARISON:  None. FINDINGS: For submitted images show placement of a lateral fixation plate and multiple screws reducing the major fracture components into near anatomic alignment. There is no new fracture or evidence of an operative complication. IMPRESSION: Well aligned major fracture components following ORIF of the proximal left humerus fracture. Electronically Signed   By: Amie Portland M.D.   On: 05/03/2015 16:54    Disposition: 01-Home or Self Care      Discharge Instructions    Call MD / Call 911    Complete by:  As directed   If you experience chest pain or shortness of breath, CALL 911 and be transported to the hospital emergency room.  If you develope a fever above 101 F, pus (white drainage) or increased drainage or redness at the wound, or calf pain, call your surgeon's office.     Constipation Prevention    Complete by:  As directed   Drink plenty of fluids.  Prune juice may be helpful.  You may use a stool softener, such as Colace (over the counter) 100 mg twice a day.  Use MiraLax (over the counter) for constipation as needed.     Diet - low sodium heart healthy    Complete by:  As  directed      Discharge instructions    Complete by:  As directed   OK to do pendulums for exercise 3 times a day No lifting with left arm Keep incision dry     Increase activity slowly as tolerated    Complete by:  As directed            Follow-up Information    Follow up with Cammy Copa, MD. Schedule an appointment as soon as possible for a visit in 20 weeks.   Specialty:  Orthopedic Surgery   Contact information:   [address] [place] [address] [phone]        Signed: Richardean Canal 05/05/2015, 8:55 AM

## 2015-05-05 NOTE — Progress Notes (Signed)
Occupational Therapy Treatment Patient Details Name: Cynthia Pace MRN: [id number] DOB: [birth year] Today's Date: 05/05/2015    History of present illness s/p fall @ 7 weeks ago. Underwent ORIF L humerus fx due to nonhealing fx.    OT comments  Pt making gradual progress toward OT goals; limited by pain but agreeable to participate in therapy following premedication. Pt tolerating gentle pendulums in sitting and gentle self/PROM FF and ABD to L UE. Reviewed safety and ADL education with pt; she verbalized understanding. D/c plan remains appropriate. Pt ready to d/c from OT standpoint but will continue to follow acutely.   Follow Up Recommendations  Other (comment);Supervision - Intermittent (outpt OT when appropriate)    Equipment Recommendations  None recommended by OT    Recommendations for Other Services      Precautions / Restrictions Precautions Precautions: Other (comment) (NWB; PROM) Required Braces or Orthoses: Sling Restrictions Weight Bearing Restrictions: Yes LUE Weight Bearing: Non weight bearing       Mobility Bed Mobility Overal bed mobility: Needs Assistance Bed Mobility: Supine to Sit     Supine to sit: Supervision;HOB elevated     General bed mobility comments: VCs for technique  Transfers Overall transfer level: Modified independent                    Balance Overall balance assessment: No apparent balance deficits (not formally assessed)                                 ADL Overall ADL's : Needs assistance/impaired     Grooming: Supervision/safety;Standing;Oral care;Wash/dry hands;Brushing hair           Upper Body Dressing : Moderate assistance;Sitting Upper Body Dressing Details (indicate cue type and reason): to don sling     Toilet Transfer: Modified Independent   Toileting- Clothing Manipulation and Hygiene: Modified independent       Functional mobility during ADLs: Modified independent General ADL  Comments: No family present for OT session. Reviewed LUE exercises, donning/doffing sling, compensatory strategies.      Vision                     Perception     Praxis      Cognition   Behavior During Therapy: Agitated Overall Cognitive Status: Within Functional Limits for tasks assessed                       Extremity/Trunk Assessment               Exercises Shoulder Exercises Pendulum Exercise: PROM;Left;10 reps;Seated Shoulder Flexion: Self ROM;PROM;Left;10 reps;Seated (0-10 degrees) Shoulder ABduction: PROM;Self ROM;Left;10 reps;Seated (0-20 degrees) Wrist Flexion: AROM;Left;10 reps;Seated Digit Composite Flexion: AROM;Left;10 reps;Seated Donning/doffing shirt without moving shoulder: Moderate assistance Method for sponge bathing under operated UE: Moderate assistance Donning/doffing sling/immobilizer: Moderate assistance Correct positioning of sling/immobilizer: Minimal assistance Pendulum exercises (written home exercise program): Minimal assistance ROM for elbow, wrist and digits of operated UE: Supervision/safety Sling wearing schedule (on at all times/off for ADL's): Supervision/safety Proper positioning of operated UE when showering: Supervision/safety Positioning of UE while sleeping: Minimal assistance   Shoulder Instructions Shoulder Instructions Donning/doffing shirt without moving shoulder: Moderate assistance Method for sponge bathing under operated UE: Moderate assistance Donning/doffing sling/immobilizer: Moderate assistance Correct positioning of sling/immobilizer: Minimal assistance Pendulum exercises (written home exercise program): Minimal assistance ROM for elbow, wrist and digits of operated  UE: Supervision/safety Sling wearing schedule (on at all times/off for ADL's): Supervision/safety Proper positioning of operated UE when showering: Supervision/safety Positioning of UE while sleeping: Minimal assistance     General  Comments      Pertinent Vitals/ Pain       Pain Assessment: 0-10 Pain Score: 10-Worst pain ever Pain Location: LUE Pain Descriptors / Indicators: Aching;Grimacing;Operative site guarding Pain Intervention(s): Limited activity within patient's tolerance;Monitored during session;Premedicated before session;Ice applied  Home Living                                          Prior Functioning/Environment              Frequency Min 2X/week     Progress Toward Goals  OT Goals(current goals can now be found in the care plan section)  Progress towards OT goals: Progressing toward goals  Acute Rehab OT Goals Patient Stated Goal: decrease pain OT Goal Formulation: With patient  Plan Discharge plan remains appropriate    Co-evaluation                 End of Session Equipment Utilized During Treatment: Other (comment) (sling)   Activity Tolerance Patient limited by pain;Patient tolerated treatment well   Patient Left in bed;with call bell/phone within reach   Nurse Communication Other (comment) (pt ready to d/c from OT standpoint)        Time: [id number] OT Time Calculation (min): 23 min  Charges: OT General Charges $OT Visit: 1 Procedure OT Treatments $Self Care/Home Management : 8-22 mins $Therapeutic Exercise: 8-22 mins  Gaye AlkenBailey A Dickie Labarre M.S., OTR/L Pager: [phone]  05/05/2015, 10:08 AM

## 2015-10-13 ENCOUNTER — Encounter (INDEPENDENT_AMBULATORY_CARE_PROVIDER_SITE_OTHER): Payer: Self-pay

## 2016-01-21 ENCOUNTER — Ambulatory Visit (INDEPENDENT_AMBULATORY_CARE_PROVIDER_SITE_OTHER): Payer: Self-pay | Admitting: Orthopedic Surgery

## 2016-01-30 ENCOUNTER — Encounter (INDEPENDENT_AMBULATORY_CARE_PROVIDER_SITE_OTHER): Payer: Self-pay | Admitting: Orthopedic Surgery

## 2016-01-30 ENCOUNTER — Ambulatory Visit (INDEPENDENT_AMBULATORY_CARE_PROVIDER_SITE_OTHER): Payer: Medicare HMO | Admitting: Orthopedic Surgery

## 2016-01-30 DIAGNOSIS — S42292S Other displaced fracture of upper end of left humerus, sequela: Secondary | ICD-10-CM

## 2016-01-30 DIAGNOSIS — S42231D 3-part fracture of surgical neck of right humerus, subsequent encounter for fracture with routine healing: Secondary | ICD-10-CM

## 2016-01-30 NOTE — Progress Notes (Signed)
Office Visit Note   Patient: Cynthia KittyCheryl A Crisp           Date of Birth: 10/25/1960           MRN: [id number] Visit Date: 01/30/2016 Requested by: Richmond CampbellKristen W. Kaplan, PA-C [address] [place], [place] [id number] PCP: Lilia ArgueKAPLAN,KRISTEN, PA-C  Subjective: Chief Complaint  Patient presents with  . Left Upper Arm - Follow-up    HPICheryl is a 55 show patient 9 months out from ORIF left proximal humerus fracture she's doing well.  Has stiffness at times doing stretching exercises.  Not taking any medications.  She is playing golf.   Denies any numbness and tingling in the hand.              Review of Systems All systems reviewed are negative as they relate to the chief complaint within the history of present illness.  Patient denies  fevers or chills.    Assessment & Plan: Visit Diagnoses:  1. Closed 3-part fracture of proximal humerus, left, sequela     Plan: Impression is healed left proximal humerus fracture with good clinical result at this time.  Still is lacking a little bit of forward flexion and abduction.  I did caution her as to the signs and symptoms of avascular necrosis.  I'll see her back as needed  Follow-Up Instructions: No Follow-up on file.   Orders:  No orders of the defined types were placed in this encounter.  No orders of the defined types were placed in this encounter.     Procedures: No procedures performed   Clinical Data: No additional findings.  Objective: Vital Signs: There were no vitals taken for this visit.  Physical Exam   Constitutional: Patient appears well-developed HEENT:  Head: Normocephalic Eyes:EOM are normal Neck: Normal range of motion Cardiovascular: Normal rate Pulmonary/chest: Effort normal Neurologic: Patient is alert Skin: Skin is warm Psychiatric: Patient has normal mood and affect    Ortho ExamExamination of the left shoulder demonstrates well-healed surgical incision.  Shoulder moves well passively without pain  finding crepitus.  Rotator cuff strength is good.  She has forward flexion and abduction both above 90.  Motor sensory function to the hand is intact  Specialty Comments:  No specialty comments available.  Imaging: No results found.   PMFS History: Patient Active Problem List   Diagnosis Date Noted  . Shoulder fracture, left 05/03/2015   Past Medical History:  Diagnosis Date  . Arthritis    "left ankle" (05/04/2015)  . Asthma   . Carpal tunnel syndrome   . Chronic bronchitis (HCC)   . Chronic lower back pain   . Elevated liver enzymes   . Environmental allergies   . Family history of adverse reaction to anesthesia    "daughter died after 2nd epidural for C-section; they were able to bring her back"  . GERD (gastroesophageal reflux disease)   . History of TMJ syndrome   . Hyperlipidemia   . Hypertension   . Kidney stones   . Restless leg syndrome   . Seasonal allergies     Family History  Problem Relation Age of Onset  . Tuberculosis Mother   . Heart disease Mother   . Cancer Father     Past Surgical History:  Procedure Laterality Date  . ABDOMINAL HYSTERECTOMY    . ANKLE SURGERY Left    severed artery and tendons  . APPENDECTOMY    . CYSTOSCOPY W/ STONE MANIPULATION  X 1  . FRACTURE  SURGERY    . ORIF HUMERUS FRACTURE Left 05/03/2015   Procedure: OPEN REDUCTION INTERNAL FIXATION (ORIF) LEFT PROXIMAL HUMERUS FRACTURE NONUNION;  Surgeon: Cammy CopaScott Gregory Dean, MD;  Location: [facility];  Service: Orthopedics;  Laterality: Left;  . ORIF PROXIMAL HUMERUS FRACTURE Left 05/03/2015  . TONSILLECTOMY     Social History   Occupational History  . Not on file.   Social History Main Topics  . Smoking status: Current Every Day Smoker    Packs/day: 0.75    Years: 16.00    Types: Cigarettes  . Smokeless tobacco: Never Used  . Alcohol use Yes     Comment: 05/04/2015 "stopped drinking 02/25/2015"  . Drug use: No  . Sexual activity: Not Currently

## 2016-04-17 ENCOUNTER — Telehealth (INDEPENDENT_AMBULATORY_CARE_PROVIDER_SITE_OTHER): Payer: Self-pay | Admitting: Orthopedic Surgery

## 2016-04-17 NOTE — Telephone Encounter (Signed)
Returned call to patient left message to return call   [phone]

## 2016-05-22 ENCOUNTER — Other Ambulatory Visit: Payer: Self-pay | Admitting: Obstetrics and Gynecology

## 2016-05-22 DIAGNOSIS — R928 Other abnormal and inconclusive findings on diagnostic imaging of breast: Secondary | ICD-10-CM

## 2016-05-27 ENCOUNTER — Other Ambulatory Visit: Payer: Medicare HMO

## 2016-05-28 ENCOUNTER — Other Ambulatory Visit: Payer: Medicare HMO

## 2016-06-02 ENCOUNTER — Ambulatory Visit
Admission: RE | Admit: 2016-06-02 | Discharge: 2016-06-02 | Disposition: A | Discharge status: Home / Self Care | Payer: Medicare HMO | Source: Ambulatory Visit | Attending: Obstetrics and Gynecology | Admitting: Obstetrics and Gynecology

## 2016-06-02 DIAGNOSIS — R928 Other abnormal and inconclusive findings on diagnostic imaging of breast: Secondary | ICD-10-CM

## 2016-11-14 ENCOUNTER — Other Ambulatory Visit: Payer: Self-pay

## 2017-02-20 ENCOUNTER — Ambulatory Visit
Admission: RE | Admit: 2017-02-20 | Discharge: 2017-02-20 | Disposition: A | Discharge status: Home / Self Care | Payer: Medicare HMO | Source: Ambulatory Visit | Attending: Physician Assistant | Admitting: Physician Assistant

## 2017-02-20 ENCOUNTER — Other Ambulatory Visit: Payer: Self-pay | Admitting: Physician Assistant

## 2017-02-20 DIAGNOSIS — R05 Cough: Secondary | ICD-10-CM

## 2017-02-20 DIAGNOSIS — R0602 Shortness of breath: Secondary | ICD-10-CM

## 2017-02-20 DIAGNOSIS — R0781 Pleurodynia: Secondary | ICD-10-CM

## 2017-02-20 DIAGNOSIS — R059 Cough, unspecified: Secondary | ICD-10-CM

## 2017-03-02 ENCOUNTER — Other Ambulatory Visit: Payer: Self-pay | Admitting: Family Medicine

## 2017-03-02 DIAGNOSIS — R1011 Right upper quadrant pain: Secondary | ICD-10-CM

## 2017-03-05 ENCOUNTER — Other Ambulatory Visit: Payer: Medicare HMO

## 2017-03-17 ENCOUNTER — Other Ambulatory Visit: Payer: Medicare HMO

## 2017-03-17 ENCOUNTER — Ambulatory Visit
Admission: RE | Admit: 2017-03-17 | Discharge: 2017-03-17 | Disposition: A | Discharge status: Home / Self Care | Payer: Medicare HMO | Source: Ambulatory Visit | Attending: Family Medicine | Admitting: Family Medicine

## 2017-03-17 DIAGNOSIS — R1011 Right upper quadrant pain: Secondary | ICD-10-CM

## 2017-06-08 ENCOUNTER — Ambulatory Visit (INDEPENDENT_AMBULATORY_CARE_PROVIDER_SITE_OTHER): Payer: Medicare HMO

## 2017-06-08 ENCOUNTER — Ambulatory Visit (INDEPENDENT_AMBULATORY_CARE_PROVIDER_SITE_OTHER): Payer: Medicare HMO | Admitting: Orthopaedic Surgery

## 2017-06-08 ENCOUNTER — Encounter (INDEPENDENT_AMBULATORY_CARE_PROVIDER_SITE_OTHER): Payer: Self-pay | Admitting: Orthopaedic Surgery

## 2017-06-08 ENCOUNTER — Telehealth (INDEPENDENT_AMBULATORY_CARE_PROVIDER_SITE_OTHER): Payer: Self-pay

## 2017-06-08 VITALS — BP 155/88 | HR 79 | Ht 64.0 in | Wt 144.0 lb

## 2017-06-08 DIAGNOSIS — M545 Low back pain, unspecified: Secondary | ICD-10-CM | POA: Insufficient documentation

## 2017-06-08 DIAGNOSIS — M25552 Pain in left hip: Secondary | ICD-10-CM

## 2017-06-08 NOTE — Telephone Encounter (Signed)
Patient would like to know if she can be worked in later this week for Left Hip/Back Pain.  Patient last seen in 2017 for Humerus Fracture.  Cb# is [phone].  Please advise.  Thank you.

## 2017-06-08 NOTE — Progress Notes (Signed)
Office Visit Note   Patient: Cynthia Pace           Date of Birth: October 22, 1960           MRN: [id number] Visit Date: 06/08/2017              Requested by: Richmond Campbell., PA-C [address] [place] [address] PCP: Richmond Campbell., PA-C   Assessment & Plan: Visit Diagnoses:  1. Pain in left hip   2. Lumbar pain     Plan: Patient likely has mild disc bulge with her intermittent symptoms, variable intensity variable in time it takes to resolve.  We discussed weight loss walking program, smoking sensation.  We discussed continued core strengthening program on her own to help with her intermittent back symptoms.  If she has increase in symptoms she can call me and let me know we can proceed with a diagnostic MRI scan.  Currently she is in therapy and had therapy earlier today.  We discussed an overall wellness program and outlined treatment plan as above which should make her symptoms less severe and less frequent.  She can resume golf activity as before.  Follow-Up Instructions: Return if symptoms worsen or fail to improve.   Orders:  Orders Placed This Encounter  Procedures  . XR HIP UNILAT W OR W/O PELVIS 2-3 VIEWS LEFT  . XR Lumbar Spine 2-3 Views   No orders of the defined types were placed in this encounter.     Procedures: No procedures performed   Clinical Data: No additional findings.   Subjective: No chief complaint on file.   HPI 57 year old female with several year history of intermittent low back pain at the lumbosacral junction on the left side that sometimes radiates laterally to the trochanter occasionally to the proximal thigh rarely to the anterior groin.  Enjoys playing golf she is had episodes off and on and has been seeing a therapist for greater than a year off and on.  She uses some massage chair, TENS unit, occasional Flexeril.  Occasionally she is had Robaxin in the past she is taken anti-inflammatories.  She smokes 1 pack/day and is  trying to work on quitting.  She has some urinary problems since childbirth x2 mild leakage problems.  Past history of trauma to her left ankle with laceration on the dorsum of her foot repair by Dr. Dorene Grebe.  She is also had left proximal humerus fracture treated with plate fixation which is healed.  No associated bowel symptoms.  She states today she is feeling good but last week she was having more pain today.  At times she can only walk short distance and then starts having increased hip pain and has to grab something to avoid falling.  He has had one falling episode in the last month of her hip feeling like it gave way.  Review of Systems 14 point review of systems positive for this left anterior ankle laceration with tendon repairs, ORIF left left humerus by Dr. August Saucer 2017.  Positive for 1 pack/day smoking history.  Positive for hypertension.  Negative for CVA, MI, negative for seizures.  She is had occasional numbness in her left leg proximally when she is symptomatic.  Otherwise negative as it pertains to above HPI.  Objective: Vital Signs: BP (!) 155/88 (BP Location: Left Arm, Patient Position: Sitting)   Pulse 79   Ht 5\' 4"  (1.626 m)   Wt 144 lb (65.3 kg)   BMI 24.72  kg/m   Physical Exam  Constitutional: She is oriented to person, place, and time. She appears well-developed.  HENT:  Head: Normocephalic.  Right Ear: External ear normal.  Left Ear: External ear normal.  Eyes: Pupils are equal, round, and reactive to light.  Neck: No tracheal deviation present. No thyromegaly present.  Cardiovascular: Normal rate.  Pulmonary/Chest: Effort normal.  Abdominal: Soft.  Neurological: She is alert and oriented to person, place, and time.  Skin: Skin is warm and dry.  Psychiatric: She has a normal mood and affect. Her behavior is normal.    Ortho Exam patient is able to toe walk she has some problems with heel walking due to anterior scar tissue and slight dimpling from her old  laceration.  For manual testing negative straight leg raising 90 degrees.  No limitation of hip internal/external rotation in sitting position without pain at extremes of range of motion and good symmetry.  Compression test knee and ankle jerk 2+ and symmetrical.  Palpable pedal pulses.  No rash over exposed skin.  Mild left trochanteric tenderness mild sciatic notch tenderness on the left.  She has fairly good core strength able to do a few sit ups. Specialty Comments:  No specialty comments available.  Imaging: Xr Hip Unilat W Or W/o Pelvis 2-3 Views Left  Result Date: 06/08/2017 AP pelvis frog-leg lateral left hip obtained and reviewed.  Negative for acute changes no degenerative changes. Impression: Normal pelvis and hip x-rays.  Xr Lumbar Spine 2-3 Views  Result Date: 06/08/2017 AP lateral lumbar spine x-rays are obtained.  This shows a right thoracolumbar curve apex at T12-L1 level at 12 degrees.  There is some endplate irregularity at L2- 3 disc space.  Negative for acute changes. Impression: Mild right thoracolumbar curve.  Endplate irregularity minimal narrowing suggesting L2-3 disc degeneration.     PMFS History: Patient Active Problem List   Diagnosis Date Noted  . Shoulder fracture, left 05/03/2015   Past Medical History:  Diagnosis Date  . Arthritis    "left ankle" (05/04/2015)  . Asthma   . Carpal tunnel syndrome   . Chronic bronchitis (HCC)   . Chronic lower back pain   . Elevated liver enzymes   . Environmental allergies   . Family history of adverse reaction to anesthesia    "daughter died after 2nd epidural for C-section; they were able to bring her back"  . GERD (gastroesophageal reflux disease)   . History of TMJ syndrome   . Hyperlipidemia   . Hypertension   . Kidney stones   . Restless leg syndrome   . Seasonal allergies     Family History  Problem Relation Age of Onset  . Tuberculosis Mother   . Heart disease Mother   . Cancer Father     Past  Surgical History:  Procedure Laterality Date  . ABDOMINAL HYSTERECTOMY    . ANKLE SURGERY Left    severed artery and tendons  . APPENDECTOMY    . CYSTOSCOPY W/ STONE MANIPULATION  X 1  . FRACTURE SURGERY    . ORIF HUMERUS FRACTURE Left 05/03/2015   Procedure: OPEN REDUCTION INTERNAL FIXATION (ORIF) LEFT PROXIMAL HUMERUS FRACTURE NONUNION;  Surgeon: Cammy CopaScott Gregory Dean, MD;  Location: [facility] OR;  Service: Orthopedics;  Laterality: Left;  . ORIF PROXIMAL HUMERUS FRACTURE Left 05/03/2015  . TONSILLECTOMY     Social History   Occupational History  . Not on file  Tobacco Use  . Smoking status: Current Every Day Smoker  Packs/day: 0.75    Years: 16.00    Pack years: 12.00    Types: Cigarettes  . Smokeless tobacco: Never Used  Substance and Sexual Activity  . Alcohol use: Yes    Comment: 05/04/2015 "stopped drinking 02/25/2015"  . Drug use: No  . Sexual activity: Not Currently

## 2017-06-08 NOTE — Telephone Encounter (Signed)
IC pain has been on going for the last 2 months Tried medication and is currently in physical therapy.  I do not have any time available for Dr August Saucerean but per Toniann FailWendy, worked her in to see Dr Ophelia CharterYates this afternoon.

## 2017-07-23 ENCOUNTER — Telehealth (INDEPENDENT_AMBULATORY_CARE_PROVIDER_SITE_OTHER): Payer: Self-pay | Admitting: Orthopaedic Surgery

## 2017-07-23 NOTE — Telephone Encounter (Signed)
Patient wanted to see if X-ray results or images could be faxed to her PCP at [facility], she has an appt today. Their fax # [phone]

## 2017-07-23 NOTE — Telephone Encounter (Signed)
Dr. Ophelia Charter cc'd last office note to PCP which included x-ray results. Could you resend office note to ensure that they have it? Thanks.

## 2017-07-24 NOTE — Telephone Encounter (Signed)
faxed

## 2017-10-27 ENCOUNTER — Other Ambulatory Visit: Payer: Self-pay | Admitting: Family Medicine

## 2017-10-27 DIAGNOSIS — R053 Chronic cough: Secondary | ICD-10-CM

## 2017-10-27 DIAGNOSIS — R05 Cough: Secondary | ICD-10-CM

## 2017-11-03 ENCOUNTER — Ambulatory Visit
Admission: RE | Admit: 2017-11-03 | Discharge: 2017-11-03 | Disposition: A | Discharge status: Home / Self Care | Payer: Medicare HMO | Source: Ambulatory Visit | Attending: Family Medicine | Admitting: Family Medicine

## 2017-11-03 DIAGNOSIS — R05 Cough: Secondary | ICD-10-CM

## 2017-11-03 DIAGNOSIS — R053 Chronic cough: Secondary | ICD-10-CM

## 2017-11-06 ENCOUNTER — Other Ambulatory Visit: Payer: Self-pay | Admitting: Family Medicine

## 2017-11-06 DIAGNOSIS — M5442 Lumbago with sciatica, left side: Principal | ICD-10-CM

## 2017-11-06 DIAGNOSIS — G8929 Other chronic pain: Secondary | ICD-10-CM

## 2017-11-25 ENCOUNTER — Ambulatory Visit
Admission: RE | Admit: 2017-11-25 | Discharge: 2017-11-25 | Disposition: A | Discharge status: Home / Self Care | Payer: Medicare HMO | Source: Ambulatory Visit | Attending: Family Medicine | Admitting: Family Medicine

## 2017-11-25 DIAGNOSIS — M5442 Lumbago with sciatica, left side: Principal | ICD-10-CM

## 2017-11-25 DIAGNOSIS — G8929 Other chronic pain: Secondary | ICD-10-CM

## 2017-12-02 ENCOUNTER — Institutional Professional Consult (permissible substitution): Payer: Medicare HMO | Admitting: Pulmonary Disease

## 2017-12-03 ENCOUNTER — Encounter (INDEPENDENT_AMBULATORY_CARE_PROVIDER_SITE_OTHER): Payer: Self-pay | Admitting: Orthopedic Surgery

## 2017-12-03 ENCOUNTER — Telehealth (INDEPENDENT_AMBULATORY_CARE_PROVIDER_SITE_OTHER): Payer: Self-pay | Admitting: Orthopedic Surgery

## 2017-12-03 ENCOUNTER — Ambulatory Visit (INDEPENDENT_AMBULATORY_CARE_PROVIDER_SITE_OTHER): Payer: Medicare HMO | Admitting: Orthopedic Surgery

## 2017-12-03 DIAGNOSIS — M25552 Pain in left hip: Secondary | ICD-10-CM

## 2017-12-03 DIAGNOSIS — M79605 Pain in left leg: Secondary | ICD-10-CM | POA: Diagnosis not present

## 2017-12-03 MED ORDER — CYCLOBENZAPRINE HCL 10 MG PO TABS: ORAL_TABLET | ORAL | 0 refills | Status: DC

## 2017-12-03 MED ORDER — ACETAMINOPHEN 500 MG PO TABS: ORAL_TABLET | ORAL | 0 refills | Status: AC

## 2017-12-03 NOTE — Telephone Encounter (Signed)
Patient called stating that she left here with more questions than answers and thinks that she would like to go ahead with the MRI.  She would also like Dr. August Saucer to call her back.  CB#[phone].  Thank you

## 2017-12-03 NOTE — Progress Notes (Signed)
Office Visit Note   Patient: Cynthia Pace           Date of Birth: [birth year]           MRN: [id number] Visit Date: 12/03/2017 Requested by: Richmond Campbell., PA-C [address] [place] [address] PCP: Richmond Campbell., PA-C  Subjective: Chief Complaint  Patient presents with  . Lower Back - Pain    HPI: Cynthia Pace is a patient here to be evaluated for back pain.  She is had an MRI scan ordered by another physician.  She is had back pain ongoing for months.  Reports radicular left leg pain with numbness and tingling in the feet.  She did see Dr. Kevan Ny a few months ago and he advised her that if it became worse that she needed to get an MRI scan.  That scan has been obtained.  There is lumbar spondylosis at the L4-5 and L5-S1 level with a small left subarticular disc protrusion with narrowing of the left lateral recess contacting the descending left S1 nerve root.  The patient has been using a cane to ambulate.  Radiographs from 6 months ago demonstrate no significant joint space narrowing in the hip joints.  The patient is on disability but was unable to compete in a golf tournament last weekend due to her symptoms.              ROS: All systems reviewed are negative as they relate to the chief complaint within the history of present illness.  Patient denies  fevers or chills.   Assessment & Plan: Visit Diagnoses:  1. Pain in left leg     Plan: Impression is likely hip arthritis based on her gait and restriction of internal rotation on the left-hand side on exam.  I think that she may also have a component of left-sided radiculopathy from that S1 nerve root being compressed.  Slightly both of these events are going on.  I meant to write her prescription for Flexeril and Tylenol.  I would refer her to Dr. Alvester Morin for L-spine ESI injection around that L5-S1 area to see how that helps her.  I think she also needs a left hip diagnostic and therapeutic injection at the time or 1 to 2  days later.  I will see her back in about a month for clinical recheck but I think it is likely that her hip is the main pain generator at this time  Follow-Up Instructions: No follow-ups on file.   Orders:  Orders Placed This Encounter  Procedures  . Ambulatory referral to Physical Medicine Rehab   Meds ordered this encounter  Medications  . cyclobenzaprine (FLEXERIL) 10 MG tablet    Sig: 1 po q 8 hrs prn    Dispense:  30 tablet    Refill:  0  . acetaminophen (TYLENOL) 500 MG tablet    Sig: 1 po bid prn    Dispense:  60 tablet    Refill:  0      Procedures: No procedures performed   Clinical Data: No additional findings.  Objective: Vital Signs: There were no vitals taken for this visit.  Physical Exam:   Constitutional: Patient appears well-developed HEENT:  Head: Normocephalic Eyes:EOM are normal Neck: Normal range of motion Cardiovascular: Normal rate Pulmonary/chest: Effort normal Neurologic: Patient is alert Skin: Skin is warm Psychiatric: Patient has normal mood and affect    Ortho Exam: Ortho exam demonstrates Trendelenburg gait to the left.  She has  pain with attempted internal rotation of the left hip but not the right.  No definite nerve root tension signs.  Pedal pulses palpable posterior tib.  Patient has no knee effusion.  She has good hip flexion strength as well as good knee extension and hamstring strength testing.  She has mild pain with forward and lateral bending.  Specialty Comments:  No specialty comments available.  Imaging: No results found.   PMFS History: Patient Active Problem List   Diagnosis Date Noted  . Lumbar pain 06/08/2017  . Shoulder fracture, left 05/03/2015   Past Medical History:  Diagnosis Date  . Arthritis    "left ankle" (05/04/2015)  . Asthma   . Carpal tunnel syndrome   . Chronic bronchitis (HCC)   . Chronic lower back pain   . Elevated liver enzymes   . Environmental allergies   . Family history of  adverse reaction to anesthesia    "daughter died after 2nd epidural for C-section; they were able to bring her back"  . GERD (gastroesophageal reflux disease)   . History of TMJ syndrome   . Hyperlipidemia   . Hypertension   . Kidney stones   . Restless leg syndrome   . Seasonal allergies     Family History  Problem Relation Age of Onset  . Tuberculosis Mother   . Heart disease Mother   . Cancer Father     Past Surgical History:  Procedure Laterality Date  . ABDOMINAL HYSTERECTOMY    . ANKLE SURGERY Left    severed artery and tendons  . APPENDECTOMY    . CYSTOSCOPY W/ STONE MANIPULATION  X 1  . FRACTURE SURGERY    . ORIF HUMERUS FRACTURE Left 05/03/2015   Procedure: OPEN REDUCTION INTERNAL FIXATION (ORIF) LEFT PROXIMAL HUMERUS FRACTURE NONUNION;  Surgeon: Cammy Copa, MD;  Location: [facility] OR;  Service: Orthopedics;  Laterality: Left;  . ORIF PROXIMAL HUMERUS FRACTURE Left 05/03/2015  . TONSILLECTOMY     Social History   Occupational History  . Not on file  Tobacco Use  . Smoking status: Current Every Day Smoker    Packs/day: 0.75    Years: 16.00    Pack years: 12.00    Types: Cigarettes  . Smokeless tobacco: Never Used  Substance and Sexual Activity  . Alcohol use: Yes    Comment: 05/04/2015 "stopped drinking 02/25/2015"  . Drug use: No  . Sexual activity: Not Currently

## 2017-12-04 ENCOUNTER — Other Ambulatory Visit (INDEPENDENT_AMBULATORY_CARE_PROVIDER_SITE_OTHER): Payer: Self-pay

## 2017-12-04 DIAGNOSIS — M545 Low back pain, unspecified: Secondary | ICD-10-CM

## 2017-12-04 NOTE — Telephone Encounter (Signed)
Ordered MRI pelvis per Dr August Saucer

## 2017-12-04 NOTE — Telephone Encounter (Signed)
I called.

## 2017-12-04 NOTE — Telephone Encounter (Signed)
Please see note from patient. Please advise.

## 2017-12-07 ENCOUNTER — Telehealth (INDEPENDENT_AMBULATORY_CARE_PROVIDER_SITE_OTHER): Payer: Self-pay | Admitting: Orthopedic Surgery

## 2017-12-07 NOTE — Telephone Encounter (Signed)
Dr August Saucer said he could call patient with results. Will hold message as reminder for him to call patient when scan results are back.

## 2017-12-07 NOTE — Telephone Encounter (Signed)
I called to schedule patients MRI review and she said she is leaving for [facility] after this week, her MRI is 10/16 and is having injection 10/17. She could come in Friday but there are no available appointments, please see if we could call with results or have her come in  # [phone]

## 2017-12-08 ENCOUNTER — Telehealth (INDEPENDENT_AMBULATORY_CARE_PROVIDER_SITE_OTHER): Payer: Self-pay | Admitting: *Deleted

## 2017-12-08 NOTE — Telephone Encounter (Signed)
Morrie Sheldon from [facility] called asking if pt had been scheduled with Dr. Alvester Morin for injection. Advised that pt is being seen at [facility] and not to Dr. Alvester Morin.

## 2017-12-09 ENCOUNTER — Ambulatory Visit
Admission: RE | Admit: 2017-12-09 | Discharge: 2017-12-09 | Disposition: A | Discharge status: Home / Self Care | Payer: Medicare HMO | Source: Ambulatory Visit | Attending: Orthopedic Surgery | Admitting: Orthopedic Surgery

## 2017-12-09 DIAGNOSIS — M25552 Pain in left hip: Secondary | ICD-10-CM

## 2017-12-10 ENCOUNTER — Other Ambulatory Visit: Payer: Medicare HMO

## 2017-12-10 ENCOUNTER — Ambulatory Visit
Admission: RE | Admit: 2017-12-10 | Discharge: 2017-12-10 | Disposition: A | Discharge status: Home / Self Care | Payer: Medicare HMO | Source: Ambulatory Visit | Attending: Orthopedic Surgery | Admitting: Orthopedic Surgery

## 2017-12-10 ENCOUNTER — Telehealth (INDEPENDENT_AMBULATORY_CARE_PROVIDER_SITE_OTHER): Payer: Self-pay | Admitting: Orthopedic Surgery

## 2017-12-10 DIAGNOSIS — M545 Low back pain, unspecified: Secondary | ICD-10-CM

## 2017-12-10 NOTE — Telephone Encounter (Signed)
Please call patient with results of MRI of her Pelvis. 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 Patient is very upset and would like to know what to do from this point. She states she needs a plan and is scheduled to be out of town soon.

## 2017-12-10 NOTE — Telephone Encounter (Signed)
See other note sent to Dr Dean Waiting to be advised.  

## 2017-12-10 NOTE — Telephone Encounter (Signed)
Patient came into the clinic stating that [facility] refused to give her the Cortisone injection based on the results of the MRI.  Patient would like to speak to Dr. August Saucer.  CB#[phone].  Thank you.

## 2017-12-10 NOTE — Telephone Encounter (Signed)
I called - ok for tramadol 1 po q 8 3 40 AND BACK TO ME TO SET UP LEFT THA

## 2017-12-11 MED ORDER — TRAMADOL HCL 50 MG PO TABS: ORAL_TABLET | ORAL | 0 refills | Status: DC

## 2017-12-11 NOTE — Telephone Encounter (Signed)
Called patient left VM to schedule appointment  °

## 2017-12-11 NOTE — Addendum Note (Signed)
Addended byPrescott Parma on: 12/11/2017 09:12 AM   Modules accepted: Orders

## 2017-12-11 NOTE — Telephone Encounter (Signed)
Can you please call patient per Dr August Saucer and have her schedule ROV with him to discuss options for her hip. Thanks.

## 2017-12-11 NOTE — Telephone Encounter (Signed)
Appointment Scheduled   12/23/17 @3 :45pm

## 2017-12-11 NOTE — Telephone Encounter (Signed)
Called to pharmacy. Patient advised done.  

## 2017-12-17 ENCOUNTER — Other Ambulatory Visit: Payer: Medicare HMO

## 2017-12-18 ENCOUNTER — Other Ambulatory Visit (INDEPENDENT_AMBULATORY_CARE_PROVIDER_SITE_OTHER): Payer: Self-pay | Admitting: Orthopedic Surgery

## 2017-12-18 ENCOUNTER — Encounter (HOSPITAL_COMMUNITY): Payer: Self-pay

## 2017-12-18 DIAGNOSIS — M87052 Idiopathic aseptic necrosis of left femur: Secondary | ICD-10-CM

## 2017-12-18 NOTE — Pre-Procedure Instructions (Signed)
GAILYA TAUER  12/18/2017      Your procedure is scheduled on December 29, 2017.  Report to [facility] Admitting at 05:30 A.M.  Call this number if you have problems the morning of surgery:  [phone]   Remember:  Do not eat or drink after midnight.     Take these medicines the morning of surgery with A SIP OF WATER : ADVAIR DISKUS albuterol (PROVENTIL HFA;VENTOLIN HFA) if needed cyclobenzaprine (FLEXERIL) if needed pantoprazole (PROTONIX)  7 days prior to surgery STOP taking any Aspirin (unless otherwise instructed by your surgeon), Aleve, Naproxen, Ibuprofen, Motrin, Advil, Goody's, BC's, all herbal medications, fish oil, and all vitamins.     Do not wear jewelry, make-up or nail polish.  Do not wear lotions, powders, or perfumes, or deodorant.  Do not shave 48 hours prior to surgery.   Do not bring valuables to the hospital.  The [facility] is not responsible for any belongings or valuables.  Contacts, dentures or bridgework may not be worn into surgery.  Leave your suitcase in the car.  After surgery it may be brought to your room.  For patients admitted to the hospital, discharge time will be determined by your treatment team.  Patients discharged the day of surgery will not be allowed to drive home.    Special instructions:   [place]- Preparing For Surgery  Before surgery, you can play an important role. Because skin is not sterile, your skin needs to be as free of germs as possible. You can reduce the number of germs on your skin by washing with CHG (chlorahexidine gluconate) Soap before surgery.  CHG is an antiseptic cleaner which kills germs and bonds with the skin to continue killing germs even after washing.    Oral Hygiene is also important to reduce your risk of infection.  Remember - BRUSH YOUR TEETH THE MORNING OF SURGERY WITH YOUR REGULAR TOOTHPASTE  Please do not use if you have an allergy to CHG or antibacterial soaps. If your skin  becomes reddened/irritated stop using the CHG.  Do not shave (including legs and underarms) for at least 48 hours prior to first CHG shower. It is OK to shave your face.  Please follow these instructions carefully.   1. Shower the NIGHT BEFORE SURGERY and the MORNING OF SURGERY with CHG.   2. If you chose to wash your hair, wash your hair first as usual with your normal shampoo.  3. After you shampoo, rinse your hair and body thoroughly to remove the shampoo.  4. Use CHG as you would any other liquid soap. You can apply CHG directly to the skin and wash gently with a scrungie or a clean washcloth.   5. Apply the CHG Soap to your body ONLY FROM THE NECK DOWN.  Do not use on open wounds or open sores. Avoid contact with your eyes, ears, mouth and genitals (private parts). Wash Face and genitals (private parts)  with your normal soap.  6. Wash thoroughly, paying special attention to the area where your surgery will be performed.  7. Thoroughly rinse your body with warm water from the neck down.  8. DO NOT shower/wash with your normal soap after using and rinsing off the CHG Soap.  9. Pat yourself dry with a CLEAN TOWEL.  10. Wear CLEAN PAJAMAS to bed the night before surgery, wear comfortable clothes the morning of surgery  11. Place CLEAN SHEETS on your bed the night of your first  shower and DO NOT SLEEP WITH PETS.    Day of Surgery:  Do not apply any deodorants/lotions.  Please wear clean clothes to the hospital/surgery center.   Remember to brush your teeth WITH YOUR REGULAR TOOTHPASTE.    Please read over the following fact sheets that you were given.

## 2017-12-21 ENCOUNTER — Encounter (HOSPITAL_COMMUNITY): Payer: Self-pay

## 2017-12-21 ENCOUNTER — Encounter (HOSPITAL_COMMUNITY)
Admission: RE | Admit: 2017-12-21 | Discharge: 2017-12-21 | Disposition: A | Discharge status: Home / Self Care | Payer: Medicare HMO | Source: Ambulatory Visit | Attending: Orthopedic Surgery | Admitting: Orthopedic Surgery

## 2017-12-21 ENCOUNTER — Other Ambulatory Visit: Payer: Self-pay

## 2017-12-21 DIAGNOSIS — M87052 Idiopathic aseptic necrosis of left femur: Secondary | ICD-10-CM | POA: Diagnosis not present

## 2017-12-21 DIAGNOSIS — Z01818 Encounter for other preprocedural examination: Secondary | ICD-10-CM | POA: Diagnosis present

## 2017-12-21 HISTORY — DX: Personal history of urinary calculi: Z87.442

## 2017-12-21 LAB — CBC
HEMATOCRIT: 46.6 % — AB (ref 36.0–46.0)
Hemoglobin: 15 g/dL (ref 12.0–15.0)
MCH: 34.9 pg — AB (ref 26.0–34.0)
MCHC: 32.2 g/dL (ref 30.0–36.0)
MCV: 108.4 fL — AB (ref 80.0–100.0)
Platelets: 250 10*3/uL (ref 150–400)
RBC: 4.3 MIL/uL (ref 3.87–5.11)
RDW: 13.6 % (ref 11.5–15.5)
WBC: 6.3 10*3/uL (ref 4.0–10.5)
nRBC: 0 % (ref 0.0–0.2)

## 2017-12-21 LAB — URINALYSIS, ROUTINE W REFLEX MICROSCOPIC
BILIRUBIN URINE: NEGATIVE
GLUCOSE, UA: NEGATIVE mg/dL
Hgb urine dipstick: NEGATIVE
KETONES UR: NEGATIVE mg/dL
Leukocytes, UA: NEGATIVE
Nitrite: NEGATIVE
PH: 7 (ref 5.0–8.0)
Protein, ur: NEGATIVE mg/dL
Specific Gravity, Urine: 1.015 (ref 1.005–1.030)

## 2017-12-21 LAB — BASIC METABOLIC PANEL
Anion gap: 9 (ref 5–15)
BUN: 7 mg/dL (ref 6–20)
CHLORIDE: 104 mmol/L (ref 98–111)
CO2: 26 mmol/L (ref 22–32)
CREATININE: 0.6 mg/dL (ref 0.44–1.00)
Calcium: 9.7 mg/dL (ref 8.9–10.3)
GFR calc Af Amer: >60 mL/min (ref >60)
GFR calc non Af Amer: >60 mL/min (ref >60)
GLUCOSE: 127 mg/dL — AB (ref 70–99)
Potassium: 3.3 mmol/L — ABNORMAL LOW (ref 3.5–5.1)
SODIUM: 139 mmol/L (ref 135–145)

## 2017-12-21 LAB — SURGICAL PCR SCREEN
MRSA, PCR: NEGATIVE
Staphylococcus aureus: NEGATIVE

## 2017-12-21 NOTE — Progress Notes (Signed)
PCP - Dr. Brett Fairy Cardiologist - denies  Chest x-ray - N/A EKG - 10/19/17 with PCP- tracing requested from [facility] Stress Test - denies ECHO - denies Cardiac Cath - denies  Sleep Study - denies having had sleep study.  Aspirin Instructions: patient instructed to stop all ASA/NSAIDS 7 days prior to surgery  Anesthesia review: yes- EKG review  Patient denies shortness of breath, fever, cough and chest pain at PAT appointment   Patient verbalized understanding of instructions that were given to them at the PAT appointment. Patient was also instructed that they will need to review over the PAT instructions again at home before surgery.

## 2017-12-23 ENCOUNTER — Ambulatory Visit (INDEPENDENT_AMBULATORY_CARE_PROVIDER_SITE_OTHER): Payer: Medicare HMO | Admitting: Orthopedic Surgery

## 2017-12-23 LAB — URINE CULTURE

## 2017-12-24 ENCOUNTER — Telehealth (INDEPENDENT_AMBULATORY_CARE_PROVIDER_SITE_OTHER): Payer: Self-pay | Admitting: Orthopedic Surgery

## 2017-12-24 NOTE — Telephone Encounter (Signed)
Patient left a voicemail stating the tramadol is not helping at all and is wondering if there is anything else to do for her pain. She said she is having surgery tuesday. Patients # [phone]

## 2017-12-24 NOTE — Telephone Encounter (Signed)
Please advise. Thanks.  

## 2017-12-25 MED ORDER — ACETAMINOPHEN-CODEINE #3 300-30 MG PO TABS: ORAL_TABLET | ORAL | 0 refills | Status: DC

## 2017-12-25 NOTE — Telephone Encounter (Signed)
Patient would like to know what she should expect after her surgery. She is asking for return call to discuss with you. Thanks.

## 2017-12-25 NOTE — Telephone Encounter (Signed)
IC s/w patient and advised.  Called to [employer].

## 2017-12-25 NOTE — Telephone Encounter (Signed)
T 3 1 po q 8 # 30 pls call thx

## 2017-12-25 NOTE — Telephone Encounter (Signed)
I called her and talked with her at length about what to expect before and after surgery.  Discussed the risk and benefits including but not limited to infection leg length inequality and the rehabilitative process involved.  All questions answered

## 2017-12-28 NOTE — H&P (Signed)
TOTAL HIP ADMISSION H&P  Patient is admitted for left total hip arthroplasty.  Subjective:  Chief Complaint: left hip pain  HPI: Cynthia Pace, 57 y.o. female, has a history of pain and functional disability in the left hip(s) due to Avascular necrosis and arthritis and patient has failed non-surgical conservative treatments for greater than 12 weeks to include NSAID's and/or analgesics, flexibility and strengthening excercises, use of assistive devices and activity modification.  Onset of symptoms was abrupt starting 1 years ago with rapidlly worsening course since that time.The patient noted no past surgery on the left hip(s).  Patient currently rates pain in the left hip at 8 out of 10 with activity. Patient has night pain, worsening of pain with activity and weight bearing, trendelenberg gait, pain that interfers with activities of daily living and pain with passive range of motion. Patient has evidence of Avascular necrosis with loss of femoral head sphericity by imaging studies. This condition presents safety issues increasing the risk of falls. This patient has had Significant worsening of symptoms with attempts at activities of daily living.  There is no current active infection.  No personal or family history of DVT or pulmonary embolism  Patient Active Problem List   Diagnosis Date Noted  . Lumbar pain 06/08/2017  . Shoulder fracture, left 05/03/2015   Past Medical History:  Diagnosis Date  . Arthritis    "left ankle" (05/04/2015)  . Asthma   . Carpal tunnel syndrome   . Chronic bronchitis (HCC)   . Chronic lower back pain   . Elevated liver enzymes   . Environmental allergies   . Family history of adverse reaction to anesthesia    "daughter died after 2nd epidural for C-section; they were able to bring her back"  . GERD (gastroesophageal reflux disease)   . History of kidney stones   . History of TMJ syndrome   . Hyperlipidemia   . Hypertension   . Kidney stones   .  Restless leg syndrome   . Seasonal allergies     Past Surgical History:  Procedure Laterality Date  . ABDOMINAL HYSTERECTOMY    . ANKLE SURGERY Left    severed artery and tendons  . APPENDECTOMY    . CYSTOSCOPY W/ STONE MANIPULATION  X 1  . FRACTURE SURGERY    . ORIF HUMERUS FRACTURE Left 05/03/2015   Procedure: OPEN REDUCTION INTERNAL FIXATION (ORIF) LEFT PROXIMAL HUMERUS FRACTURE NONUNION;  Surgeon: Cammy Copa, MD;  Location: [facility] OR;  Service: Orthopedics;  Laterality: Left;  . ORIF PROXIMAL HUMERUS FRACTURE Left 05/03/2015  . TONSILLECTOMY      No current facility-administered medications for this encounter.    Current Outpatient Medications  Medication Sig Dispense Refill Last Dose  . ADVAIR DISKUS 250-50 MCG/DOSE AEPB Inhale 1 puff into the lungs daily.   2 Taking  . albuterol (PROVENTIL HFA;VENTOLIN HFA) 108 (90 Base) MCG/ACT inhaler Inhale 2 puffs into the lungs every 6 (six) hours as needed for wheezing or shortness of breath.   Taking  . calcium carbonate (TUMS - DOSED IN MG ELEMENTAL CALCIUM) 500 MG chewable tablet Chew 1 tablet by mouth 2 (two) times daily as needed for indigestion or heartburn.     . cyclobenzaprine (FLEXERIL) 10 MG tablet 1 po q 8 hrs prn (Patient taking differently: Take 10 mg by mouth 3 (three) times daily as needed for muscle spasms. ) 30 tablet 0   . ibuprofen (ADVIL,MOTRIN) 200 MG tablet Take 400 mg by mouth every 6 (  six) hours as needed for headache or moderate pain.    Taking  . Multiple Vitamin (MULTIVITAMIN WITH MINERALS) TABS tablet Take 1 tablet by mouth at bedtime.    Taking  . pantoprazole (PROTONIX) 40 MG tablet Take 40 mg by mouth daily.  2 Taking  . trolamine salicylate (ASPERCREME) 10 % cream Apply 1 application topically as needed for muscle pain.     Marland Kitchen acetaminophen (TYLENOL) 500 MG tablet 1 po bid prn (Patient not taking: Reported on 12/16/2017) 60 tablet 0 Not Taking at Unknown time  . acetaminophen-codeine (TYLENOL #3) 300-30 MG  tablet 1 po q 8 hrs prn pain 30 tablet 0   . Probiotic Product (ALIGN) 4 MG CAPS Take by mouth.     . traMADol (ULTRAM) 50 MG tablet 1 po q 8 hrs prn pain (Patient not taking: Reported on 12/16/2017) 40 tablet 0 Completed Course at Unknown time   Allergies  Allergen Reactions  . Sulfa Antibiotics Rash    Social History   Tobacco Use  . Smoking status: Current Every Day Smoker    Packs/day: 0.75    Years: 16.00    Pack years: 12.00    Types: Cigarettes  . Smokeless tobacco: Never Used  Substance Use Topics  . Alcohol use: Yes    Alcohol/week: 1.0 - 2.0 standard drinks    Types: 1 - 2 Glasses of wine per week    Family History  Problem Relation Age of Onset  . Tuberculosis Mother   . Heart disease Mother   . Cancer Father      Review of Systems  Musculoskeletal: Positive for joint pain.  All other systems reviewed and are negative.   Objective:  Physical Exam  Constitutional: She appears well-developed.  HENT:  Head: Normocephalic.  Eyes: Pupils are equal, round, and reactive to light.  Neck: Normal range of motion.  Cardiovascular: Normal rate.  Respiratory: Effort normal.  Neurological: She is alert.  Skin: Skin is warm.  Psychiatric: She has a normal mood and affect.  Examination of the left hip demonstrates pain with internal/external rotation.  Trendelenburg gait is present.  Leg lengths approximately equal.  Pedal pulses palpable in the posterior tib region no trochanteric tenderness is noted.  No nerve root tension signs.  Vital signs in last 24 hours:    Labs:   Estimated body mass index is 24.72 kg/m as calculated from the following:   Height as of 12/21/17: 5\' 4"  (1.626 m).   Weight as of 12/21/17: 65.3 kg.   Imaging Review Plain radiographs demonstrate moderate degenerative joint disease of the left hip(s). The bone quality appears to be fair for age and reported activity level.  MRI scan shows avascular necrosis with loss of femoral head  sphericity    Preoperative templating of the joint replacement has been completed, documented, and submitted to the Operating Room personnel in order to optimize intra-operative equipment management.     Assessment/Plan:  End stage arthritis, left hip(s)  The patient history, physical examination, clinical judgement of the provider and imaging studies are consistent with end stage degenerative joint disease of the left hip(s) and total hip arthroplasty is deemed medically necessary. The treatment options including medical management, injection therapy, arthroscopy and arthroplasty were discussed at length. The risks and benefits of total hip arthroplasty were presented and reviewed. The risks due to aseptic loosening, infection, stiffness, dislocation/subluxation,  thromboembolic complications and other imponderables were discussed.  The patient acknowledged the explanation, agreed to proceed with the  plan and consent was signed. Patient is being admitted for inpatient treatment for surgery, pain control, PT, OT, prophylactic antibiotics, VTE prophylaxis, progressive ambulation and ADL's and discharge planning.The patient is planning to be discharged home with home health services

## 2017-12-29 ENCOUNTER — Inpatient Hospital Stay (HOSPITAL_COMMUNITY): Payer: Medicare HMO

## 2017-12-29 ENCOUNTER — Other Ambulatory Visit: Payer: Self-pay

## 2017-12-29 ENCOUNTER — Inpatient Hospital Stay (HOSPITAL_COMMUNITY): Payer: Medicare HMO | Admitting: Physician Assistant

## 2017-12-29 ENCOUNTER — Inpatient Hospital Stay (HOSPITAL_COMMUNITY)
Admission: RE | Admit: 2017-12-29 | Discharge: 2017-12-31 | DRG: 470 | Disposition: A | Discharge status: Home Health | Payer: Medicare HMO | Source: Ambulatory Visit | Attending: Orthopedic Surgery | Admitting: Orthopedic Surgery

## 2017-12-29 ENCOUNTER — Encounter (HOSPITAL_COMMUNITY)
Admission: RE | Disposition: A | Discharge status: Home Health | Payer: Self-pay | Source: Ambulatory Visit | Attending: Orthopedic Surgery

## 2017-12-29 ENCOUNTER — Inpatient Hospital Stay (HOSPITAL_COMMUNITY): Payer: Medicare HMO | Admitting: Certified Registered Nurse Anesthetist

## 2017-12-29 ENCOUNTER — Encounter (HOSPITAL_COMMUNITY): Payer: Self-pay | Admitting: Certified Registered Nurse Anesthetist

## 2017-12-29 DIAGNOSIS — I10 Essential (primary) hypertension: Secondary | ICD-10-CM | POA: Diagnosis present

## 2017-12-29 DIAGNOSIS — F17210 Nicotine dependence, cigarettes, uncomplicated: Secondary | ICD-10-CM | POA: Diagnosis present

## 2017-12-29 DIAGNOSIS — M87052 Idiopathic aseptic necrosis of left femur: Secondary | ICD-10-CM | POA: Diagnosis not present

## 2017-12-29 DIAGNOSIS — J45909 Unspecified asthma, uncomplicated: Secondary | ICD-10-CM | POA: Diagnosis present

## 2017-12-29 DIAGNOSIS — K219 Gastro-esophageal reflux disease without esophagitis: Secondary | ICD-10-CM | POA: Diagnosis present

## 2017-12-29 DIAGNOSIS — M1610 Unilateral primary osteoarthritis, unspecified hip: Secondary | ICD-10-CM | POA: Diagnosis present

## 2017-12-29 DIAGNOSIS — E785 Hyperlipidemia, unspecified: Secondary | ICD-10-CM | POA: Diagnosis present

## 2017-12-29 DIAGNOSIS — Z791 Long term (current) use of non-steroidal anti-inflammatories (NSAID): Secondary | ICD-10-CM

## 2017-12-29 DIAGNOSIS — Z79899 Other long term (current) drug therapy: Secondary | ICD-10-CM

## 2017-12-29 DIAGNOSIS — Z7951 Long term (current) use of inhaled steroids: Secondary | ICD-10-CM

## 2017-12-29 DIAGNOSIS — G2581 Restless legs syndrome: Secondary | ICD-10-CM | POA: Diagnosis present

## 2017-12-29 DIAGNOSIS — Z419 Encounter for procedure for purposes other than remedying health state, unspecified: Secondary | ICD-10-CM

## 2017-12-29 DIAGNOSIS — M87852 Other osteonecrosis, left femur: Secondary | ICD-10-CM | POA: Diagnosis present

## 2017-12-29 DIAGNOSIS — M1612 Unilateral primary osteoarthritis, left hip: Secondary | ICD-10-CM | POA: Diagnosis present

## 2017-12-29 HISTORY — PX: TOTAL HIP ARTHROPLASTY: SHX124

## 2017-12-29 SURGERY — ARTHROPLASTY, HIP, TOTAL, ANTERIOR APPROACH
Anesthesia: Spinal | Site: Hip | Laterality: Left

## 2017-12-29 MED ORDER — TRANEXAMIC ACID 1000 MG/10ML IV SOLN
1.5000 mg/kg/h | INTRAVENOUS | Status: AC
  Administered 2017-12-29: 100 mg/kg/h via INTRAVENOUS
  Filled 2017-12-29: qty 10

## 2017-12-29 MED ORDER — LACTATED RINGERS IV SOLN
INTRAVENOUS | Status: DC | PRN
  Administered 2017-12-29 (×2): via INTRAVENOUS

## 2017-12-29 MED ORDER — LIDOCAINE 2% (20 MG/ML) 5 ML SYRINGE
INTRAMUSCULAR | Status: AC
  Filled 2017-12-29: qty 5

## 2017-12-29 MED ORDER — BUPIVACAINE IN DEXTROSE 0.75-8.25 % IT SOLN
INTRATHECAL | Status: DC | PRN
  Administered 2017-12-29: 1.8 mL via INTRATHECAL

## 2017-12-29 MED ORDER — PROPOFOL 10 MG/ML IV BOLUS
INTRAVENOUS | Status: DC | PRN
  Administered 2017-12-29 (×3): 30 mg via INTRAVENOUS
  Administered 2017-12-29 (×2): 20 mg via INTRAVENOUS
  Administered 2017-12-29: 40 mg via INTRAVENOUS
  Administered 2017-12-29: 30 mg via INTRAVENOUS
  Administered 2017-12-29: 20 mg via INTRAVENOUS
  Administered 2017-12-29: 40 mg via INTRAVENOUS
  Administered 2017-12-29: 20 mg via INTRAVENOUS

## 2017-12-29 MED ORDER — DOCUSATE SODIUM 100 MG PO CAPS
100.0000 mg | ORAL_CAPSULE | Freq: Two times a day (BID) | ORAL | Status: DC
  Administered 2017-12-29 – 2017-12-31 (×4): 100 mg via ORAL
  Filled 2017-12-29 (×4): qty 1

## 2017-12-29 MED ORDER — GLYCOPYRROLATE PF 0.2 MG/ML IJ SOSY
PREFILLED_SYRINGE | INTRAMUSCULAR | Status: AC
  Filled 2017-12-29: qty 1

## 2017-12-29 MED ORDER — ONDANSETRON HCL 4 MG PO TABS: 4.0000 mg | ORAL_TABLET | Freq: Four times a day (QID) | ORAL | Status: DC | PRN

## 2017-12-29 MED ORDER — PHENYLEPHRINE HCL 10 MG/ML IJ SOLN
INTRAMUSCULAR | Status: DC | PRN
  Administered 2017-12-29 (×3): 80 ug via INTRAVENOUS
  Administered 2017-12-29: 40 ug via INTRAVENOUS

## 2017-12-29 MED ORDER — MORPHINE SULFATE 10 MG/ML IJ SOLN
INTRAMUSCULAR | Status: DC | PRN
  Administered 2017-12-29 (×2): 2 mg via INTRAVENOUS

## 2017-12-29 MED ORDER — CALCIUM CARBONATE ANTACID 500 MG PO CHEW: 1.0000 | CHEWABLE_TABLET | Freq: Two times a day (BID) | ORAL | Status: DC | PRN

## 2017-12-29 MED ORDER — MEPERIDINE HCL 50 MG/ML IJ SOLN: 6.2500 mg | INTRAMUSCULAR | Status: DC | PRN

## 2017-12-29 MED ORDER — ADULT MULTIVITAMIN W/MINERALS CH
1.0000 | ORAL_TABLET | Freq: Every day | ORAL | Status: DC
  Administered 2017-12-29 – 2017-12-30 (×2): 1 via ORAL
  Filled 2017-12-29 (×2): qty 1

## 2017-12-29 MED ORDER — FENTANYL CITRATE (PF) 100 MCG/2ML IJ SOLN
INTRAMUSCULAR | Status: AC
  Filled 2017-12-29: qty 2

## 2017-12-29 MED ORDER — METOCLOPRAMIDE HCL 5 MG/ML IJ SOLN: 10.0000 mg | Freq: Once | INTRAMUSCULAR | Status: DC | PRN

## 2017-12-29 MED ORDER — OXYCODONE HCL 5 MG PO TABS
5.0000 mg | ORAL_TABLET | ORAL | Status: DC | PRN
  Administered 2017-12-29: 5 mg via ORAL
  Administered 2017-12-29 – 2017-12-31 (×10): 10 mg via ORAL
  Filled 2017-12-29: qty 2
  Filled 2017-12-29: qty 1
  Filled 2017-12-29 (×8): qty 2

## 2017-12-29 MED ORDER — CEFAZOLIN SODIUM-DEXTROSE 2-4 GM/100ML-% IV SOLN
2.0000 g | INTRAVENOUS | Status: AC
  Administered 2017-12-29: 2 g via INTRAVENOUS

## 2017-12-29 MED ORDER — GABAPENTIN 300 MG PO CAPS
300.0000 mg | ORAL_CAPSULE | Freq: Three times a day (TID) | ORAL | Status: DC
  Administered 2017-12-29 – 2017-12-31 (×5): 300 mg via ORAL
  Filled 2017-12-29 (×5): qty 1

## 2017-12-29 MED ORDER — HYDROMORPHONE HCL 1 MG/ML IJ SOLN
0.2500 mg | INTRAMUSCULAR | Status: DC | PRN
  Administered 2017-12-29 (×2): 0.5 mg via INTRAVENOUS

## 2017-12-29 MED ORDER — LIDOCAINE HCL (CARDIAC) PF 100 MG/5ML IV SOSY
PREFILLED_SYRINGE | INTRAVENOUS | Status: DC | PRN
  Administered 2017-12-29: 40 mg via INTRAVENOUS
  Administered 2017-12-29: 20 mg via INTRAVENOUS
  Administered 2017-12-29: 80 mg via INTRAVENOUS
  Administered 2017-12-29: 40 mg via INTRAVENOUS

## 2017-12-29 MED ORDER — PROPOFOL 500 MG/50ML IV EMUL
INTRAVENOUS | Status: DC | PRN
  Administered 2017-12-29: 75 ug/kg/min via INTRAVENOUS

## 2017-12-29 MED ORDER — BUPIVACAINE HCL (PF) 0.25 % IJ SOLN
INTRAMUSCULAR | Status: AC
  Filled 2017-12-29: qty 20

## 2017-12-29 MED ORDER — LACTATED RINGERS IV SOLN: INTRAVENOUS | Status: DC

## 2017-12-29 MED ORDER — SODIUM CHLORIDE 0.9 % IJ SOLN
INTRAMUSCULAR | Status: DC | PRN
  Administered 2017-12-29: 20 mL

## 2017-12-29 MED ORDER — ALBUTEROL SULFATE (2.5 MG/3ML) 0.083% IN NEBU: 3.0000 mL | INHALATION_SOLUTION | Freq: Four times a day (QID) | RESPIRATORY_TRACT | Status: DC | PRN

## 2017-12-29 MED ORDER — FENTANYL CITRATE (PF) 250 MCG/5ML IJ SOLN
INTRAMUSCULAR | Status: AC
  Filled 2017-12-29: qty 5

## 2017-12-29 MED ORDER — HYDROMORPHONE HCL 1 MG/ML IJ SOLN
INTRAMUSCULAR | Status: AC
  Filled 2017-12-29: qty 1

## 2017-12-29 MED ORDER — MENTHOL 3 MG MT LOZG: 1.0000 | LOZENGE | OROMUCOSAL | Status: DC | PRN

## 2017-12-29 MED ORDER — METHOCARBAMOL 500 MG PO TABS
500.0000 mg | ORAL_TABLET | Freq: Four times a day (QID) | ORAL | Status: DC | PRN
  Administered 2017-12-29 – 2017-12-31 (×5): 500 mg via ORAL
  Filled 2017-12-29 (×5): qty 1

## 2017-12-29 MED ORDER — ROCURONIUM BROMIDE 50 MG/5ML IV SOSY
PREFILLED_SYRINGE | INTRAVENOUS | Status: AC
  Filled 2017-12-29: qty 5

## 2017-12-29 MED ORDER — ACETAMINOPHEN 325 MG PO TABS: 325.0000 mg | ORAL_TABLET | Freq: Four times a day (QID) | ORAL | Status: DC | PRN

## 2017-12-29 MED ORDER — BUPIVACAINE LIPOSOME 1.3 % IJ SUSP
20.0000 mL | INTRAMUSCULAR | Status: AC
  Administered 2017-12-29: 20 mL
  Filled 2017-12-29: qty 20

## 2017-12-29 MED ORDER — PROPOFOL 10 MG/ML IV BOLUS
INTRAVENOUS | Status: AC
  Filled 2017-12-29: qty 20

## 2017-12-29 MED ORDER — FENTANYL CITRATE (PF) 100 MCG/2ML IJ SOLN
INTRAMUSCULAR | Status: DC | PRN
  Administered 2017-12-29 (×3): 50 ug via INTRAVENOUS

## 2017-12-29 MED ORDER — LIDOCAINE 2% (20 MG/ML) 5 ML SYRINGE
INTRAMUSCULAR | Status: AC
  Filled 2017-12-29: qty 10

## 2017-12-29 MED ORDER — CEFAZOLIN SODIUM-DEXTROSE 2-4 GM/100ML-% IV SOLN
INTRAVENOUS | Status: AC
  Filled 2017-12-29: qty 100

## 2017-12-29 MED ORDER — HYDROMORPHONE HCL 1 MG/ML IJ SOLN: 0.5000 mg | INTRAMUSCULAR | Status: DC | PRN

## 2017-12-29 MED ORDER — PHENYLEPHRINE 40 MCG/ML (10ML) SYRINGE FOR IV PUSH (FOR BLOOD PRESSURE SUPPORT)
PREFILLED_SYRINGE | INTRAVENOUS | Status: AC
  Filled 2017-12-29: qty 10

## 2017-12-29 MED ORDER — METOCLOPRAMIDE HCL 5 MG PO TABS: 5.0000 mg | ORAL_TABLET | Freq: Three times a day (TID) | ORAL | Status: DC | PRN

## 2017-12-29 MED ORDER — DEXAMETHASONE SODIUM PHOSPHATE 10 MG/ML IJ SOLN
INTRAMUSCULAR | Status: AC
  Filled 2017-12-29: qty 1

## 2017-12-29 MED ORDER — MIDAZOLAM HCL 2 MG/2ML IJ SOLN
INTRAMUSCULAR | Status: AC
  Filled 2017-12-29: qty 2

## 2017-12-29 MED ORDER — ONDANSETRON HCL 4 MG/2ML IJ SOLN
INTRAMUSCULAR | Status: AC
  Filled 2017-12-29: qty 4

## 2017-12-29 MED ORDER — MORPHINE SULFATE (PF) 4 MG/ML IV SOLN
INTRAVENOUS | Status: AC
  Filled 2017-12-29: qty 2

## 2017-12-29 MED ORDER — CEFAZOLIN SODIUM 1 G IJ SOLR
INTRAMUSCULAR | Status: AC
  Filled 2017-12-29: qty 20

## 2017-12-29 MED ORDER — CEFAZOLIN SODIUM-DEXTROSE 1-4 GM/50ML-% IV SOLN
1.0000 g | Freq: Four times a day (QID) | INTRAVENOUS | Status: AC
  Administered 2017-12-29 (×2): 1 g via INTRAVENOUS
  Filled 2017-12-29 (×2): qty 50

## 2017-12-29 MED ORDER — PHENOL 1.4 % MT LIQD: 1.0000 | OROMUCOSAL | Status: DC | PRN

## 2017-12-29 MED ORDER — BUPIVACAINE HCL (PF) 0.25 % IJ SOLN
INTRAMUSCULAR | Status: DC | PRN
  Administered 2017-12-29: 20 mL

## 2017-12-29 MED ORDER — OXYCODONE HCL 5 MG PO TABS
ORAL_TABLET | ORAL | Status: AC
  Administered 2017-12-29: 5 mg via ORAL
  Filled 2017-12-29: qty 2

## 2017-12-29 MED ORDER — ASPIRIN 81 MG PO CHEW
81.0000 mg | CHEWABLE_TABLET | Freq: Two times a day (BID) | ORAL | Status: DC
  Administered 2017-12-29 – 2017-12-31 (×4): 81 mg via ORAL
  Filled 2017-12-29 (×4): qty 1

## 2017-12-29 MED ORDER — FENTANYL CITRATE (PF) 100 MCG/2ML IJ SOLN
25.0000 ug | INTRAMUSCULAR | Status: DC | PRN
  Administered 2017-12-29 (×3): 50 ug via INTRAVENOUS

## 2017-12-29 MED ORDER — METHOCARBAMOL 1000 MG/10ML IJ SOLN
500.0000 mg | Freq: Four times a day (QID) | INTRAVENOUS | Status: DC | PRN
  Filled 2017-12-29: qty 5

## 2017-12-29 MED ORDER — ONDANSETRON HCL 4 MG/2ML IJ SOLN: 4.0000 mg | Freq: Four times a day (QID) | INTRAMUSCULAR | Status: DC | PRN

## 2017-12-29 MED ORDER — MIDAZOLAM HCL 2 MG/2ML IJ SOLN
INTRAMUSCULAR | Status: DC | PRN
  Administered 2017-12-29: 2 mg via INTRAVENOUS

## 2017-12-29 MED ORDER — CHLORHEXIDINE GLUCONATE 4 % EX LIQD: 60.0000 mL | Freq: Once | CUTANEOUS | Status: DC

## 2017-12-29 MED ORDER — METOCLOPRAMIDE HCL 5 MG/ML IJ SOLN: 5.0000 mg | Freq: Three times a day (TID) | INTRAMUSCULAR | Status: DC | PRN

## 2017-12-29 MED ORDER — PANTOPRAZOLE SODIUM 40 MG PO TBEC
40.0000 mg | DELAYED_RELEASE_TABLET | Freq: Every day | ORAL | Status: DC
  Administered 2017-12-30 – 2017-12-31 (×2): 40 mg via ORAL
  Filled 2017-12-29 (×2): qty 1

## 2017-12-29 MED ORDER — LACTATED RINGERS IV SOLN
INTRAVENOUS | Status: AC
  Administered 2017-12-29: 17:00:00 via INTRAVENOUS

## 2017-12-29 MED ORDER — MOMETASONE FURO-FORMOTEROL FUM 200-5 MCG/ACT IN AERO
2.0000 | INHALATION_SPRAY | Freq: Two times a day (BID) | RESPIRATORY_TRACT | Status: DC
  Filled 2017-12-29: qty 8.8

## 2017-12-29 SURGICAL SUPPLY — 56 items
BAG DECANTER FOR FLEXI CONT (MISCELLANEOUS) ×3 IMPLANT
BLADE CLIPPER SURG (BLADE) ×3 IMPLANT
BLADE SAW SGTL 18X1.27X75 (BLADE) ×2 IMPLANT
BLADE SAW SGTL 18X1.27X75MM (BLADE) ×1
CELLS DAT CNTRL 66122 CELL SVR (MISCELLANEOUS) ×1 IMPLANT
CLOSURE STERI-STRIP 1/2X4 (GAUZE/BANDAGES/DRESSINGS) ×1
CLOSURE WOUND 1/2 X4 (GAUZE/BANDAGES/DRESSINGS) ×1
CLSR STERI-STRIP ANTIMIC 1/2X4 (GAUZE/BANDAGES/DRESSINGS) ×2 IMPLANT
COVER PERINEAL POST (MISCELLANEOUS) ×3 IMPLANT
COVER SURGICAL LIGHT HANDLE (MISCELLANEOUS) ×3 IMPLANT
COVER WAND RF STERILE (DRAPES) ×3 IMPLANT
CUP ACET PINNACLE SECTR 48MM (Joint) ×1 IMPLANT
DRAPE C-ARM 42X72 X-RAY (DRAPES) ×3 IMPLANT
DRAPE STERI IOBAN 125X83 (DRAPES) ×3 IMPLANT
DRAPE U-SHAPE 47X51 STRL (DRAPES) ×9 IMPLANT
DRSG AQUACEL AG ADV 3.5X 6 (GAUZE/BANDAGES/DRESSINGS) ×3 IMPLANT
DRSG AQUACEL AG ADV 3.5X10 (GAUZE/BANDAGES/DRESSINGS) ×3 IMPLANT
DURAPREP 26ML APPLICATOR (WOUND CARE) ×3 IMPLANT
ELECT BLADE 4.0 EZ CLEAN MEGAD (MISCELLANEOUS) ×3
ELECT REM PT RETURN 9FT ADLT (ELECTROSURGICAL) ×3
ELECTRODE BLDE 4.0 EZ CLN MEGD (MISCELLANEOUS) ×1 IMPLANT
ELECTRODE REM PT RTRN 9FT ADLT (ELECTROSURGICAL) ×1 IMPLANT
GLOVE BIOGEL PI IND STRL 8 (GLOVE) ×1 IMPLANT
GLOVE BIOGEL PI INDICATOR 8 (GLOVE) ×2
GLOVE SURG ORTHO 8.0 STRL STRW (GLOVE) ×6 IMPLANT
GOWN STRL REUS W/ TWL LRG LVL3 (GOWN DISPOSABLE) ×3 IMPLANT
GOWN STRL REUS W/TWL LRG LVL3 (GOWN DISPOSABLE) ×6
HANDPIECE INTERPULSE COAX TIP (DISPOSABLE) ×2
HEAD FEMORAL 32 CERAMIC (Hips) ×3 IMPLANT
HOOD PEEL AWAY FLYTE STAYCOOL (MISCELLANEOUS) ×9 IMPLANT
KIT BASIN OR (CUSTOM PROCEDURE TRAY) ×3 IMPLANT
KIT TURNOVER KIT B (KITS) ×3 IMPLANT
LINER ACET 32X48 (Liner) ×3 IMPLANT
NEEDLE HYPO 22GX1.5 SAFETY (NEEDLE) ×3 IMPLANT
NS IRRIG 1000ML POUR BTL (IV SOLUTION) ×3 IMPLANT
PACK TOTAL JOINT (CUSTOM PROCEDURE TRAY) ×3 IMPLANT
PAD ARMBOARD 7.5X6 YLW CONV (MISCELLANEOUS) ×3 IMPLANT
PINNSECTOR W/GRIP ACE CUP 48MM (Joint) ×3 IMPLANT
RTRCTR WOUND ALEXIS 18CM MED (MISCELLANEOUS) ×3
SCREW 6.5MMX25MM (Screw) ×3 IMPLANT
SET HNDPC FAN SPRY TIP SCT (DISPOSABLE) ×1 IMPLANT
STEM CORAIL KA12 (Stem) ×3 IMPLANT
STRIP CLOSURE SKIN 1/2X4 (GAUZE/BANDAGES/DRESSINGS) ×2 IMPLANT
SUT ETHIBOND NAB CT1 #1 30IN (SUTURE) ×6 IMPLANT
SUT MNCRL AB 3-0 PS2 18 (SUTURE) ×3 IMPLANT
SUT VIC AB 0 CT1 27 (SUTURE) ×6
SUT VIC AB 0 CT1 27XBRD ANBCTR (SUTURE) ×3 IMPLANT
SUT VIC AB 1 CT1 27 (SUTURE) ×6
SUT VIC AB 1 CT1 27XBRD ANBCTR (SUTURE) ×3 IMPLANT
SUT VIC AB 2-0 CT1 27 (SUTURE) ×4
SUT VIC AB 2-0 CT1 TAPERPNT 27 (SUTURE) ×2 IMPLANT
SYR 30ML LL (SYRINGE) ×3 IMPLANT
TOWEL OR 17X24 6PK STRL BLUE (TOWEL DISPOSABLE) ×3 IMPLANT
TOWEL OR 17X26 10 PK STRL BLUE (TOWEL DISPOSABLE) ×3 IMPLANT
TRAY FOLEY MTR SLVR 16FR STAT (SET/KITS/TRAYS/PACK) ×3 IMPLANT
WATER STERILE IRR 1000ML POUR (IV SOLUTION) ×3 IMPLANT

## 2017-12-29 NOTE — Anesthesia Postprocedure Evaluation (Signed)
Anesthesia Post Note  Patient: Cynthia Pace  Procedure(s) Performed: LEFT TOTAL HIP ARTHROPLASTY ANTERIOR APPROACH (Left Hip)     Patient location during evaluation: PACU Anesthesia Type: Spinal Level of consciousness: awake and alert Pain management: pain level controlled Vital Signs Assessment: post-procedure vital signs reviewed and stable Respiratory status: spontaneous breathing and respiratory function stable Cardiovascular status: blood pressure returned to baseline and stable Postop Assessment: no headache, no backache, spinal receding and no apparent nausea or vomiting Anesthetic complications: no    Last Vitals:  Vitals:   12/29/17 1555 12/29/17 1625  BP:  129/79  Pulse:  81  Resp:  18  Temp: 36.4 C 37.1 C  SpO2:  95%    Last Pain:  Vitals:   12/29/17 1625  TempSrc: Oral  PainSc: 0-No pain                 Phillips Grout

## 2017-12-29 NOTE — Op Note (Signed)
NAME: CHAITRA, MAST MEDICAL RECORD [id number] ACCOUNT [id number] DATE OF BIRTH:[birth year] FACILITY: [facility] LOCATION: [facility] PHYSICIAN:Angelys Yetman Diamantina Providence, MD  OPERATIVE REPORT  DATE OF PROCEDURE:  12/29/2017  PREOPERATIVE DIAGNOSIS:  Left hip avascular necrosis.  POSTOPERATIVE DIAGNOSIS:  Left hip avascular necrosis.  PROCEDURE:  Left hip replacement.  SURGEON:  Cammy Copa, MD  ASSISTANT:  Patrick Jupiter, RNFA  INDICATIONS:  The patient is a 57 year old patient with avascular necrosis of the left femoral head.  She presents for operative management after explanation of risks and benefits.  PROCEDURE IN DETAIL:  The patient was brought to the operating room where spinal anesthetic was induced, preoperative antibiotics were administered, and time-out was called.  The patient was placed on the Hana bed with the left and right hip in the  appropriate holders.  The left hip region was prescrubbed with alcohol and Betadine, allowed to air dry, prepped with DuraPrep solution and draped in a sterile manner.  Time-out was called.  Incision was made 2 cm posterior and distal to the anterior  superior iliac crest.  Skin and subcutaneous tissue were sharply divided.  Fascia over the tensor fascia lata was incised.  The plane between the tensor and the rectus was developed.  Crossing vessels were coagulated.  Blunt Cobra retractors were placed  on the anterior and posterior aspect of the femoral neck.  Capsular incision, T-shaped, was then made and tagged.  At this time, the femoral head cut was made under fluoroscopic guidance.  The head was removed.  Avascular necrosis and collapse were  present.  At this time, the labrum was excised.  A bent [place] retractor was placed.  The acetabular socket was then reamed up to 47 mm and approximately 45 degrees of abduction and about 15 degrees of anteversion.  Bleeding bone was encountered.  At this  time, the cup was placed in good position and  alignment.  A very good fit was obtained.  The patient had reasonably good bone, but 1 screw was placed.  Liner was then placed, which was a neutral liner.  Attention was then directed towards the femur.   External rotation to about 120 was performed.  A Mueller retractor was placed along with a trochanteric retractor.  Conjoined tendon was released, and then the tension was reset and we extended the leg to about 140 of external rotation.  Proximal  broaching was then performed after lateralization with a box cutter.  This was done parallel to the posterior cortex of the native femur.  The patient had a very good fit with the size 12.  Calcar planing was performed.  The hip was then reduced with a  +1 and +5, 32 mm trial ball.  Best stability was achieved with the +1.  This gave about slightly less than 5 mm difference in leg length.  At this time, thorough irrigation was performed, and the femoral stem was removed.  A true stem was placed with the  same depth achieved.  Very good fit was confirmed in the AP and lateral planes under fluoroscopy.  At this time,  the +1 ceramic ball was placed.  Same stability parameters were maintained including good stability with internal rotation and flexion  along with external rotation of about 60 degrees and 60 degrees of hip extension.  Thorough irrigation was then again performed.  A solution of Marcaine, saline and Exparel was placed around the capsule.  Capsule was closed using #1 Vicryl suture  followed by interrupted inverted 0  Vicryl suture to close the fascia lata, followed by 0 Vicryl suture, 2-0 Vicryl suture and 3-0 Monocryl in the skin itself.  An Aquacel dressing was placed.  Leg lengths were approximately equal at the conclusion of the  case.  The patient tolerated the procedure well without immediate complications.  He was transferred to the recovery room in stable condition.  IMPLANTS UTILIZED:  DePuy Corail stem size 12 with 32 mm +1 ceramic ball and a  neutral liner with 48 mm cup with 1 screw.  This was a Pinnacle press-fit cup.  LN/NUANCE  D:12/29/2017 T:12/29/2017 JOB:[id number]

## 2017-12-29 NOTE — Brief Op Note (Signed)
12/29/2017  11:03 AM  PATIENT:  Cynthia Pace  57 y.o. female  PRE-OPERATIVE DIAGNOSIS:  left hip avascular necrosis  POST-OPERATIVE DIAGNOSIS:  left hip avascular necrosis  PROCEDURE:  Procedure(s): LEFT TOTAL HIP ARTHROPLASTY ANTERIOR APPROACH  SURGEON:  Surgeon(s): August Saucer, Corrie Mckusick, MD  ASSISTANT: Patrick Jupiter rnfa  ANESTHESIA:   spinal  EBL: 400 ml    Total I/O In: 1000 [I.V.:1000] Out: 500 [Blood:500]  BLOOD ADMINISTERED: none  DRAINS: none   LOCAL MEDICATIONS USED:  Marcaine clonidine  SPECIMEN:  No Specimen  COUNTS:  YES  TOURNIQUET:  * No tourniquets in log *  DICTATION: .Other Dictation: Dictation Number [id number]  PLAN OF CARE: Admit to inpatient   PATIENT DISPOSITION:  PACU - hemodynamically stable

## 2017-12-29 NOTE — Anesthesia Preprocedure Evaluation (Signed)
Anesthesia Evaluation  Patient identified by MRN, date of birth, ID band Patient awake    Reviewed: Allergy & Precautions, NPO status , Patient's Chart, lab work & pertinent test results  Airway Mallampati: II  TM Distance: >3 FB Neck ROM: Full    Dental no notable dental hx.    Pulmonary asthma , Current Smoker,    Pulmonary exam normal breath sounds clear to auscultation       Cardiovascular hypertension, Pt. on medications Normal cardiovascular exam Rhythm:Regular Rate:Normal     Neuro/Psych negative neurological ROS  negative psych ROS   GI/Hepatic negative GI ROS, Neg liver ROS,   Endo/Other  negative endocrine ROS  Renal/GU negative Renal ROS  negative genitourinary   Musculoskeletal negative musculoskeletal ROS (+)   Abdominal   Peds negative pediatric ROS (+)  Hematology negative hematology ROS (+)   Anesthesia Other Findings   Reproductive/Obstetrics negative OB ROS                             Anesthesia Physical Anesthesia Plan  ASA: II  Anesthesia Plan: Spinal   Post-op Pain Management:    Induction: Intravenous  PONV Risk Score and Plan: 1 and Treatment may vary due to age or medical condition  Airway Management Planned: Simple Face Mask  Additional Equipment:   Intra-op Plan:   Post-operative Plan:   Informed Consent: I have reviewed the patients History and Physical, chart, labs and discussed the procedure including the risks, benefits and alternatives for the proposed anesthesia with the patient or authorized representative who has indicated his/her understanding and acceptance.   Dental advisory given  Plan Discussed with: CRNA  Anesthesia Plan Comments:         Anesthesia Quick Evaluation

## 2017-12-29 NOTE — Anesthesia Procedure Notes (Signed)
Spinal  Patient location during procedure: OR Staffing Anesthesiologist: Reginal Wojcicki, MD Performed: anesthesiologist  Preanesthetic Checklist Completed: patient identified, site marked, surgical consent, pre-op evaluation, timeout performed, IV checked, risks and benefits discussed and monitors and equipment checked Spinal Block Patient position: sitting Prep: DuraPrep Patient monitoring: heart rate, continuous pulse ox and blood pressure Approach: right paramedian Location: L3-4 Injection technique: single-shot Needle Needle type: Sprotte  Needle gauge: 24 G Needle length: 9 cm Additional Notes Expiration date of kit checked and confirmed. Patient tolerated procedure well, without complications.       

## 2017-12-29 NOTE — Transfer of Care (Signed)
Immediate Anesthesia Transfer of Care Note  Patient: Cynthia Pace  Procedure(s) Performed: LEFT TOTAL HIP ARTHROPLASTY ANTERIOR APPROACH (Left Hip)  Patient Location: PACU  Anesthesia Type:MAC and Spinal  Level of Consciousness: awake, alert  and oriented  Airway & Oxygen Therapy: Patient Spontanous Breathing  Post-op Assessment: Report given to RN, Post -op Vital signs reviewed and stable and Patient moving all extremities  Post vital signs: Reviewed and stable  Last Vitals:  Vitals Value Taken Time  BP 134/73 12/29/2017 11:09 AM  Temp 36.1 C 12/29/2017 11:09 AM  Pulse 89 12/29/2017 11:10 AM  Resp 13 12/29/2017 11:10 AM  SpO2 100 % 12/29/2017 11:10 AM  Vitals shown include unvalidated device data.  Last Pain:  Vitals:   12/29/17 1109  TempSrc:   PainSc: (P) 10-Worst pain ever         Complications: No apparent anesthesia complications

## 2017-12-29 NOTE — Interval H&P Note (Signed)
History and Physical Interval Note:  12/29/2017 7:19 AM  Cynthia Pace  has presented today for surgery, with the diagnosis of left hip avascular necrosis  The various methods of treatment have been discussed with the patient and family. After consideration of risks, benefits and other options for treatment, the patient has consented to  Procedure(s): LEFT TOTAL HIP ARTHROPLASTY ANTERIOR APPROACH (Left) as a surgical intervention .  The patient's history has been reviewed, patient examined, no change in status, stable for surgery.  I have reviewed the patient's chart and labs.  Questions were answered to the patient's satisfaction.     Burnard Bunting

## 2017-12-29 NOTE — Evaluation (Signed)
Physical Therapy Evaluation Patient Details Name: Cynthia Pace MRN: [id number] DOB: 1960-09-04 Today's Date: 12/29/2017   History of Present Illness  Pt is a 57 y/o female s/p elective  L THA, direct anterior approach. PMH includes asthma, HTN, L ORIF fracture, L ankle surgery, and L wrist surgery.   Clinical Impression  Pt s/p surgery above with deficits below. Ambulation limited to bathroom and back secondary to pain. Required min to min guard A for mobility using RW. Reviewed supine HEP. Will continue to follow acutely to maximize functional mobility independence and safety.     Follow Up Recommendations Follow surgeon's recommendation for DC plan and follow-up therapies;Supervision for mobility/OOB    Equipment Recommendations  Rolling walker with 5" wheels;3in1 (PT)    Recommendations for Other Services       Precautions / Restrictions Precautions Precautions: None Restrictions Weight Bearing Restrictions: Yes LLE Weight Bearing: Weight bearing as tolerated      Mobility  Bed Mobility Overal bed mobility: Needs Assistance Bed Mobility: Supine to Sit     Supine to sit: Min assist     General bed mobility comments: Min A for LLE assist. Increased time required.   Transfers Overall transfer level: Needs assistance Equipment used: Rolling walker (2 wheeled) Transfers: Sit to/from Stand Sit to Stand: Min assist         General transfer comment: Min A for lift assist and steadying.   Ambulation/Gait Ambulation/Gait assistance: Min guard;Min assist Gait Distance (Feet): 15 Feet Assistive device: Rolling walker (2 wheeled) Gait Pattern/deviations: Step-to pattern;Decreased step length - right;Decreased step length - left;Decreased weight shift to left;Antalgic Gait velocity: Decreased    General Gait Details: Slow, antalgic gait. Pt walking on toes on LLE, and encouraged pt to increase weightshift to LLE throughout mobility. Distance limited to bathroom and back  secondary to pain.   Stairs            Wheelchair Mobility    Modified Rankin (Stroke Patients Only)       Balance Overall balance assessment: Needs assistance Sitting-balance support: No upper extremity supported;Feet supported Sitting balance-Leahy Scale: Good     Standing balance support: Bilateral upper extremity supported;During functional activity Standing balance-Leahy Scale: Poor Standing balance comment: Reliant on BUE support                              Pertinent Vitals/Pain Pain Assessment: 0-10 Pain Score: 8  Pain Location: L hip Pain Descriptors / Indicators: Aching;Operative site guarding Pain Intervention(s): Limited activity within patient's tolerance;Monitored during session;Repositioned    Home Living Family/patient expects to be discharged to:: Private residence Living Arrangements: Spouse/significant other Available Help at Discharge: Family;Available 24 hours/day Type of Home: House Home Access: Stairs to enter Entrance Stairs-Rails: None Entrance Stairs-Number of Steps: 2 Home Layout: Two level Home Equipment: Walker - standard;Cane - single point      Prior Function Level of Independence: Independent with assistive device(s)         Comments: Pt has been using cane and standard walker for ambulation secondary to pain     Hand Dominance        Extremity/Trunk Assessment   Upper Extremity Assessment Upper Extremity Assessment: Overall WFL for tasks assessed    Lower Extremity Assessment Lower Extremity Assessment: LLE deficits/detail LLE Deficits / Details: Deficits consistent with post op pain and weakness. Able to perform ther ex below.        Communication  Communication: No difficulties  Cognition Arousal/Alertness: Awake/alert Behavior During Therapy: WFL for tasks assessed/performed Overall Cognitive Status: Within Functional Limits for tasks assessed                                         General Comments General comments (skin integrity, edema, etc.): Pt's husband present during session.     Exercises Total Joint Exercises Ankle Circles/Pumps: AROM;Both;20 reps Quad Sets: AROM;Both;10 reps   Assessment/Plan    PT Assessment Patient needs continued PT services  PT Problem List Decreased strength;Decreased activity tolerance;Decreased balance;Decreased range of motion;Decreased mobility;Decreased knowledge of use of DME;Decreased knowledge of precautions;Pain       PT Treatment Interventions DME instruction;Gait training;Functional mobility training;Stair training;Therapeutic activities;Therapeutic exercise;Balance training;Patient/family education    PT Goals (Current goals can be found in the Care Plan section)  Acute Rehab PT Goals Patient Stated Goal: to decrease pain  PT Goal Formulation: With patient Time For Goal Achievement: 01/12/18 Potential to Achieve Goals: Good    Frequency 7X/week   Barriers to discharge        Co-evaluation               AM-PAC PT "6 Clicks" Daily Activity  Outcome Measure Difficulty turning over in bed (including adjusting bedclothes, sheets and blankets)?: A Little Difficulty moving from lying on back to sitting on the side of the bed? : Unable Difficulty sitting down on and standing up from a chair with arms (e.g., wheelchair, bedside commode, etc,.)?: Unable Help needed moving to and from a bed to chair (including a wheelchair)?: A Little Help needed walking in hospital room?: A Little Help needed climbing 3-5 steps with a railing? : A Lot 6 Click Score: 13    End of Session Equipment Utilized During Treatment: Gait belt Activity Tolerance: Patient limited by pain Patient left: in chair;with call bell/phone within reach;with family/visitor present Nurse Communication: Mobility status PT Visit Diagnosis: Other abnormalities of gait and mobility (R26.89);Muscle weakness (generalized) (M62.81);Pain Pain -  Right/Left: Left Pain - part of body: Hip    Time: 1610-9604 PT Time Calculation (min) (ACUTE ONLY): 19 min   Charges:   PT Evaluation $PT Eval Low Complexity: 1 Low          Gladys Damme, PT, DPT  Acute Rehabilitation Services  Pager: [phone] Office: [phone]   Lehman Prom 12/29/2017, 6:23 PM

## 2017-12-30 ENCOUNTER — Encounter (HOSPITAL_COMMUNITY): Payer: Self-pay | Admitting: Orthopedic Surgery

## 2017-12-30 LAB — GLUCOSE, CAPILLARY: GLUCOSE-CAPILLARY: 116 mg/dL — AB (ref 70–99)

## 2017-12-30 MED ORDER — OXYCODONE HCL ER 10 MG PO T12A
10.0000 mg | EXTENDED_RELEASE_TABLET | Freq: Two times a day (BID) | ORAL | Status: DC
  Administered 2017-12-30 (×2): 10 mg via ORAL
  Filled 2017-12-30 (×2): qty 1

## 2017-12-30 NOTE — Plan of Care (Signed)
  Problem: Pain Managment: Goal: General experience of comfort will improve Outcome: Progressing   Problem: Safety: Goal: Ability to remain free from injury will improve Outcome: Progressing   

## 2017-12-30 NOTE — Progress Notes (Signed)
Pt stated that MD agreed to increase her PRN oxycodone from [facility] to [facility], called MD, left message to verify, awaiting MD's callback.

## 2017-12-30 NOTE — Plan of Care (Signed)

## 2017-12-30 NOTE — Progress Notes (Signed)
Subjective: Pt stable - walked to bathroom   Objective: Vital signs in last 24 hours: Temp:  [97 F (36.1 C)-98.7 F (37.1 C)] 97.8 F (36.6 C) (11/06 0500) Pulse Rate:  [66-92] 84 (11/06 0500) Resp:  [8-29] 17 (11/06 0500) BP: (96-151)/(56-90) 127/68 (11/06 0500) SpO2:  [90 %-100 %] 100 % (11/06 0500)  Intake/Output from previous day: 11/05 0701 - 11/06 0700 In: 2740 [P.O.:240; I.V.:2500] Out: 1850 [Urine:1350; Blood:500] Intake/Output this shift: No intake/output data recorded.  Exam:  Dorsiflexion/Plantar flexion intact  Labs: No results for input(s): HGB in the last 72 hours. No results for input(s): WBC, RBC, HCT, PLT in the last 72 hours. No results for input(s): NA, K, CL, CO2, BUN, CREATININE, GLUCOSE, CALCIUM in the last 72 hours. No results for input(s): LABPT, INR in the last 72 hours.  Assessment/Plan: Plan PT today and dc to home in am   [employer] 12/30/2017, 8:27 AM

## 2017-12-30 NOTE — Care Management Note (Addendum)
Case Management Note  Patient Details  Name: Cynthia Pace MRN: [id number] Date of Birth: 1960/08/01  Subjective/Objective:                    Action/Plan:  Patient pre assigned to [facility] per Tiffany, clinical liaison. Spoke w Harrold Donath from [employer] who will be providing RW for patient.  Expected Discharge Date:                  Expected Discharge Plan:     In-House Referral:     Discharge planning Services  CM Consult  Post Acute Care Choice:  Home Health, Durable Medical Equipment Choice offered to:  Patient  DME Arranged:  Walker rolling DME Agency:  [employer]  HH Arranged:  PT HH Agency:  [facility] (now [facility])  Status of Service:  Complete  If discussed at [employer] of Stay Meetings, dates discussed:    Additional Comments:  Lawerance Sabal, RN 12/30/2017, 11:11 AM

## 2017-12-30 NOTE — Progress Notes (Signed)
Physical Therapy Treatment Patient Details Name: Cynthia Pace MRN: [id number] DOB: 1961-01-25 Today's Date: 12/30/2017    History of Present Illness Pt is a 57 y/o female s/p elective  L THA, direct anterior approach. PMH includes asthma, HTN, L ORIF fracture, L ankle surgery, and L wrist surgery.     PT Comments    Continuing work on functional mobility and activity tolerance;  Steadily improving activity tolerance; Questions solicited and answered; on track for dc home tomorrow   Follow Up Recommendations  Follow surgeon's recommendation for DC plan and follow-up therapies;Supervision for mobility/OOB     Equipment Recommendations  Rolling walker with 5" wheels    Recommendations for Other Services       Precautions / Restrictions Precautions Precautions: None Restrictions LLE Weight Bearing: Weight bearing as tolerated    Mobility  Bed Mobility Overal bed mobility: Needs Assistance Bed Mobility: Supine to Sit     Supine to sit: Min guard     General bed mobility comments: Cues for technique; good push up on elbows to come to sit  Transfers Overall transfer level: Needs assistance Equipment used: Rolling walker (2 wheeled) Transfers: Sit to/from Stand Sit to Stand: Min guard         General transfer comment: Cues to pre-position for comfort, and for hand placment and safety  Ambulation/Gait Ambulation/Gait assistance: Min guard Gait Distance (Feet): 140 Feet Assistive device: Rolling walker (2 wheeled) Gait Pattern/deviations: Step-through pattern(emerging)         Stairs             Wheelchair Mobility    Modified Rankin (Stroke Patients Only)       Balance     Sitting balance-Leahy Scale: Good       Standing balance-Leahy Scale: Fair                              Cognition Arousal/Alertness: Awake/alert Behavior During Therapy: WFL for tasks assessed/performed Overall Cognitive Status: Within Functional Limits  for tasks assessed                                        Exercises Total Joint Exercises Ankle Circles/Pumps: AROM;Both;20 reps Quad Sets: AROM;Left;10 reps Gluteal Sets: Both;10 reps Towel Squeeze: AROM;Both;10 reps Heel Slides: AAROM;Left;10 reps Hip ABduction/ADduction: AAROM;Left;10 reps    General Comments General comments (skin integrity, edema, etc.): Husband present during session      Pertinent Vitals/Pain Pain Assessment: 0-10 Pain Score: 6  Pain Location: L hip Pain Descriptors / Indicators: Aching;Operative site guarding Pain Intervention(s): Premedicated before session;Monitored during session;Ice applied    Home Living                      Prior Function            PT Goals (current goals can now be found in the care plan section) Acute Rehab PT Goals Patient Stated Goal: back to golf PT Goal Formulation: With patient Time For Goal Achievement: 01/12/18 Potential to Achieve Goals: Good Progress towards PT goals: Progressing toward goals    Frequency    7X/week      PT Plan Current plan remains appropriate    Co-evaluation              AM-PAC PT "6 Clicks" Daily Activity  Outcome Measure  Difficulty turning over in bed (including adjusting bedclothes, sheets and blankets)?: A Little Difficulty moving from lying on back to sitting on the side of the bed? : A Little Difficulty sitting down on and standing up from a chair with arms (e.g., wheelchair, bedside commode, etc,.)?: A Little Help needed moving to and from a bed to chair (including a wheelchair)?: A Little Help needed walking in hospital room?: A Little Help needed climbing 3-5 steps with a railing? : A Little 6 Click Score: 18    End of Session Equipment Utilized During Treatment: Gait belt Activity Tolerance: Patient tolerated treatment well Patient left: in chair;with call bell/phone within reach Nurse Communication: Mobility status PT Visit  Diagnosis: Other abnormalities of gait and mobility (R26.89);Muscle weakness (generalized) (M62.81);Pain Pain - Right/Left: Left Pain - part of body: Hip     Time: 3086-5784 PT Time Calculation (min) (ACUTE ONLY): 32 min  Charges:  $Gait Training: 8-22 mins $Therapeutic Exercise: 8-22 mins                     Van Clines, PT  Acute Rehabilitation Services Pager [phone] Office [phone]    Levi Aland 12/30/2017, 4:13 PM

## 2017-12-30 NOTE — Progress Notes (Signed)
Physical Therapy Treatment Patient Details Name: Cynthia Pace MRN: [id number] DOB: 01-06-1961 Today's Date: 12/30/2017    History of Present Illness Pt is a 57 y/o female s/p elective  L THA, direct anterior approach. PMH includes asthma, HTN, L ORIF fracture, L ankle surgery, and L wrist surgery.     PT Comments    Continuing work on functional mobility and activity tolerance;  Overall very nice improvements in gait distance and activity tolerance; initiated stair training; on track fo rdc home tomorrow   Follow Up Recommendations  Follow surgeon's recommendation for DC plan and follow-up therapies;Supervision for mobility/OOB     Equipment Recommendations  Rolling walker with 5" wheels    Recommendations for Other Services       Precautions / Restrictions Precautions Precautions: None Restrictions Weight Bearing Restrictions: Yes LLE Weight Bearing: Weight bearing as tolerated    Mobility  Bed Mobility Overal bed mobility: Needs Assistance Bed Mobility: Supine to Sit     Supine to sit: Min assist     General bed mobility comments: Cues for technique; good half bridge Using RLE to move center of mass to EOB; min handheld assist to pull to sit  Transfers Overall transfer level: Needs assistance Equipment used: Rolling walker (2 wheeled) Transfers: Sit to/from Stand Sit to Stand: Min guard         General transfer comment: Cues to pre-position for comfort, and for hand placment and safety  Ambulation/Gait Ambulation/Gait assistance: Min guard Gait Distance (Feet): 70 Feet Assistive device: Rolling walker (2 wheeled) Gait Pattern/deviations: Step-through pattern(emerging) Gait velocity: Decreased    General Gait Details: Better steps with foot flat LLE; cues to stand tall on L in stance   Stairs Stairs: Yes Stairs assistance: Min assist Stair Management: One rail Left;Step to pattern;Sideways Number of Stairs: 4 General stair comments: cues for  sequence and technqiue; painful   Wheelchair Mobility    Modified Rankin (Stroke Patients Only)       Balance     Sitting balance-Leahy Scale: Good       Standing balance-Leahy Scale: Poor Standing balance comment: Reliant on BUE support                             Cognition Arousal/Alertness: Awake/alert Behavior During Therapy: WFL for tasks assessed/performed Overall Cognitive Status: Within Functional Limits for tasks assessed                                        Exercises Total Joint Exercises Ankle Circles/Pumps: AROM;Both;20 reps Quad Sets: AROM;Left;10 reps Gluteal Sets: Both;10 reps Towel Squeeze: AROM;Both;10 reps Heel Slides: AAROM;Left;10 reps Hip ABduction/ADduction: AAROM;Left;10 reps    General Comments        Pertinent Vitals/Pain Pain Assessment: 0-10 Pain Score: 8  Pain Location: L hip Pain Descriptors / Indicators: Aching;Operative site guarding Pain Intervention(s): Monitored during session    Home Living                      Prior Function            PT Goals (current goals can now be found in the care plan section) Acute Rehab PT Goals Patient Stated Goal: to decrease pain  PT Goal Formulation: With patient Time For Goal Achievement: 01/12/18 Potential to Achieve Goals: Good Progress towards PT goals: Progressing  toward goals    Frequency    7X/week      PT Plan Current plan remains appropriate    Co-evaluation              AM-PAC PT "6 Clicks" Daily Activity  Outcome Measure  Difficulty turning over in bed (including adjusting bedclothes, sheets and blankets)?: A Little Difficulty moving from lying on back to sitting on the side of the bed? : Unable Difficulty sitting down on and standing up from a chair with arms (e.g., wheelchair, bedside commode, etc,.)?: A Little Help needed moving to and from a bed to chair (including a wheelchair)?: A Little Help needed walking in  hospital room?: A Little Help needed climbing 3-5 steps with a railing? : A Lot 6 Click Score: 15    End of Session Equipment Utilized During Treatment: Gait belt Activity Tolerance: Patient tolerated treatment well Patient left: in chair;with call bell/phone within reach Nurse Communication: Mobility status PT Visit Diagnosis: Other abnormalities of gait and mobility (R26.89);Muscle weakness (generalized) (M62.81);Pain Pain - Right/Left: Left Pain - part of body: Hip     Time: 1610-9604 PT Time Calculation (min) (ACUTE ONLY): 37 min  Charges:  $Gait Training: 8-22 mins $Therapeutic Exercise: 8-22 mins                     Van Clines, PT  Acute Rehabilitation Services Pager [phone] Office [phone]'    Cynthia Pace 12/30/2017, 12:50 PM

## 2017-12-31 ENCOUNTER — Telehealth: Payer: Self-pay

## 2017-12-31 MED ORDER — OXYCODONE HCL 5 MG PO TABS: 5.0000 mg | ORAL_TABLET | ORAL | 0 refills | Status: DC | PRN

## 2017-12-31 MED ORDER — DOCUSATE SODIUM 100 MG PO CAPS: 100.0000 mg | ORAL_CAPSULE | Freq: Two times a day (BID) | ORAL | 0 refills | Status: AC

## 2017-12-31 MED ORDER — ASPIRIN 81 MG PO CHEW: 81.0000 mg | CHEWABLE_TABLET | Freq: Two times a day (BID) | ORAL | 0 refills | Status: AC

## 2017-12-31 MED ORDER — GABAPENTIN 300 MG PO CAPS: 300.0000 mg | ORAL_CAPSULE | Freq: Three times a day (TID) | ORAL | 0 refills | Status: AC

## 2017-12-31 MED ORDER — METHOCARBAMOL 500 MG PO TABS: 500.0000 mg | ORAL_TABLET | Freq: Four times a day (QID) | ORAL | 0 refills | Status: AC | PRN

## 2017-12-31 NOTE — Progress Notes (Signed)
Physical Therapy Treatment Patient Details Name: Cynthia Pace MRN: [id number] DOB: 01/17/1961 Today's Date: 12/31/2017    History of Present Illness Pt is a 57 y/o female s/p elective  L THA, direct anterior approach. PMH includes asthma, HTN, L ORIF fracture, L ankle surgery, and L wrist surgery.     PT Comments    Patient is progressing well toward PT goals. Pt is able to ambulate 200 ft with RW and ascend/descend 10 steps with with supervision/min guard for safety. HEP handout reviewed and pt tolerated 1/2 of HEP this session. Current plan remains appropriate.   Follow Up Recommendations  Follow surgeon's recommendation for DC plan and follow-up therapies;Supervision for mobility/OOB     Equipment Recommendations  Rolling walker with 5" wheels    Recommendations for Other Services       Precautions / Restrictions Precautions Precautions: None Restrictions Weight Bearing Restrictions: Yes LLE Weight Bearing: Weight bearing as tolerated    Mobility  Bed Mobility Overal bed mobility: Needs Assistance Bed Mobility: Supine to Sit     Supine to sit: Supervision     General bed mobility comments: cues for technique (pt used R LE to bring L LE to EOB) and use of rail   Transfers Overall transfer level: Needs assistance Equipment used: Rolling walker (2 wheeled) Transfers: Sit to/from Stand Sit to Stand: Min guard         General transfer comment: cues for safe use of AD  Ambulation/Gait Ambulation/Gait assistance: Supervision Gait Distance (Feet): 200 Feet Assistive device: Rolling walker (2 wheeled) Gait Pattern/deviations: Step-through pattern;Decreased stride length;Decreased weight shift to left(emerging) Gait velocity: Decreased    General Gait Details: cues for increased stride length and L heel strike/toe off   Stairs Stairs: Yes Stairs assistance: Min guard Stair Management: One rail Left;Step to pattern;Sideways Number of Stairs: 10 General  stair comments: cues for sequencing and technique    Wheelchair Mobility    Modified Rankin (Stroke Patients Only)       Balance     Sitting balance-Leahy Scale: Good       Standing balance-Leahy Scale: Fair                              Cognition Arousal/Alertness: Awake/alert Behavior During Therapy: WFL for tasks assessed/performed Overall Cognitive Status: Within Functional Limits for tasks assessed                                        Exercises Total Joint Exercises Ankle Circles/Pumps: AROM;Both Short Arc Quad: AROM;AAROM;Left;Strengthening Heel Slides: AAROM;Left;Strengthening Hip ABduction/ADduction: Left;AROM Long Arc Quad: AROM;Left;Seated    General Comments        Pertinent Vitals/Pain Pain Assessment: Faces Faces Pain Scale: Hurts little more Pain Location: L hip Pain Descriptors / Indicators: Aching;Operative site guarding Pain Intervention(s): Monitored during session;Repositioned;Premedicated before session    Home Living                      Prior Function            PT Goals (current goals can now be found in the care plan section) Acute Rehab PT Goals Patient Stated Goal: back to golf Progress towards PT goals: Progressing toward goals    Frequency    7X/week      PT Plan Current plan remains appropriate  Co-evaluation              AM-PAC PT "6 Clicks" Daily Activity  Outcome Measure  Difficulty turning over in bed (including adjusting bedclothes, sheets and blankets)?: Unable Difficulty moving from lying on back to sitting on the side of the bed? : Unable Difficulty sitting down on and standing up from a chair with arms (e.g., wheelchair, bedside commode, etc,.)?: Unable Help needed moving to and from a bed to chair (including a wheelchair)?: A Little Help needed walking in hospital room?: A Little Help needed climbing 3-5 steps with a railing? : A Little 6 Click Score:  12    End of Session Equipment Utilized During Treatment: Gait belt Activity Tolerance: Patient tolerated treatment well Patient left: in chair;with call bell/phone within reach Nurse Communication: Mobility status PT Visit Diagnosis: Other abnormalities of gait and mobility (R26.89);Muscle weakness (generalized) (M62.81);Pain Pain - Right/Left: Left Pain - part of body: Hip     Time: 9562-1308 PT Time Calculation (min) (ACUTE ONLY): 38 min  Charges:  $Gait Training: 8-22 mins $Therapeutic Exercise: 8-22 mins                     Erline Levine, PTA Acute Rehabilitation Services Pager: [phone] Office: [phone]     Carolynne Edouard 12/31/2017, 10:56 AM

## 2017-12-31 NOTE — Progress Notes (Signed)
Written and verbal discharge instructions given to the patient.  The patient and her husband verbalize understanding discharge instructions. The patient will have follow up with [facility].  The patient is discharged to the home with her husband.

## 2017-12-31 NOTE — Telephone Encounter (Signed)
Opened in error

## 2017-12-31 NOTE — Discharge Summary (Signed)
Physician Discharge Summary  Patient ID: Cynthia Pace MRN: [id number] DOB/AGE: 10-16-1960 57 y.o.  Admit date: 12/29/2017 Discharge date: 12/31/2017  Admission Diagnoses:  Active Problems:   Hip arthritis   Discharge Diagnoses:  Same  Surgeries: Procedure(s): LEFT TOTAL HIP ARTHROPLASTY ANTERIOR APPROACH on 12/29/2017   Consultants:   Discharged Condition: Stable  Hospital Course: Cynthia Pace is an 57 y.o. female who was admitted 12/29/2017 with a chief complaint of left hip pain, and found to have a diagnosis of left hip arthritis and avascular necrosis.  They were brought to the operating room on 12/29/2017 and underwent the above named procedures.  Patient tolerated the procedure well without immediate complication.  She was mobilized with physical therapy on postop day #1 and 2.  She is actually moving recently well in the room and out in the hall prior to discharge.  She is discharged home in good condition weightbearing as tolerated with home health PT for 1 week.  Pain medicine aspirin for DVT prophylaxis and muscle relaxers prescribed.  Follow-up with me in 10 days. Antibiotics given:  Anti-infectives (From admission, onward)   Start     Dose/Rate Route Frequency Ordered Stop   12/29/17 1700  ceFAZolin (ANCEF) IVPB 1 g/50 mL premix     1 g 100 mL/hr over 30 Minutes Intravenous Every 6 hours 12/29/17 1624 12/30/17 0008   12/29/17 0636  ceFAZolin (ANCEF) 2-4 GM/100ML-% IVPB    Note to Pharmacy:  Kathrene Bongo   : cabinet override      12/29/17 0636 12/29/17 0745   12/29/17 0630  ceFAZolin (ANCEF) IVPB 2g/100 mL premix     2 g 200 mL/hr over 30 Minutes Intravenous On call to O.R. 12/29/17 4098 12/29/17 0745    .  Recent vital signs:  Vitals:   12/31/17 0433 12/31/17 1014  BP: 136/70 134/79  Pulse: 93 95  Resp: 16 17  Temp: 98 F (36.7 C)   SpO2: 100% 93%    Recent laboratory studies:  Results for orders placed or performed during the hospital encounter of  12/29/17  Glucose, capillary  Result Value Ref Range   Glucose-Capillary 116 (H) 70 - 99 mg/dL    Discharge Medications:   Allergies as of 12/31/2017      Reactions   Sulfa Antibiotics Rash      Medication List    STOP taking these medications   acetaminophen-codeine 300-30 MG tablet Commonly known as:  TYLENOL #3   cyclobenzaprine 10 MG tablet Commonly known as:  FLEXERIL   traMADol 50 MG tablet Commonly known as:  ULTRAM     TAKE these medications   acetaminophen 500 MG tablet Commonly known as:  TYLENOL 1 po bid prn   ADVAIR DISKUS 250-50 MCG/DOSE Aepb Generic drug:  Fluticasone-Salmeterol Inhale 1 puff into the lungs daily.   albuterol 108 (90 Base) MCG/ACT inhaler Commonly known as:  PROVENTIL HFA;VENTOLIN HFA Inhale 2 puffs into the lungs every 6 (six) hours as needed for wheezing or shortness of breath.   ALIGN 4 MG Caps Take by mouth.   aspirin 81 MG chewable tablet Chew 1 tablet (81 mg total) by mouth 2 (two) times daily.   calcium carbonate 500 MG chewable tablet Commonly known as:  TUMS - dosed in mg elemental calcium Chew 1 tablet by mouth 2 (two) times daily as needed for indigestion or heartburn.   docusate sodium 100 MG capsule Commonly known as:  COLACE Take 1 capsule (100 mg total) by mouth  2 (two) times daily.   gabapentin 300 MG capsule Commonly known as:  NEURONTIN Take 1 capsule (300 mg total) by mouth 3 (three) times daily.   ibuprofen 200 MG tablet Commonly known as:  ADVIL,MOTRIN Take 400 mg by mouth every 6 (six) hours as needed for headache or moderate pain.   methocarbamol 500 MG tablet Commonly known as:  ROBAXIN Take 1 tablet (500 mg total) by mouth every 6 (six) hours as needed for muscle spasms.   multivitamin with minerals Tabs tablet Take 1 tablet by mouth at bedtime.   oxyCODONE 5 MG immediate release tablet Commonly known as:  Oxy IR/ROXICODONE Take 1-2 tablets (5-10 mg total) by mouth every 4 (four) hours as  needed for moderate pain (pain score 4-6).   pantoprazole 40 MG tablet Commonly known as:  PROTONIX Take 40 mg by mouth daily.   trolamine salicylate 10 % cream Commonly known as:  ASPERCREME Apply 1 application topically as needed for muscle pain.       Diagnostic Studies: Mr Pelvis W/o Contrast  Result Date: 12/10/2017 CLINICAL DATA:  Left hip pain for several months.  No known injury. EXAM: MR OF THE BILATERAL HIPS WITHOUT CONTRAST TECHNIQUE: Multiplanar, multisequence MR imaging was performed. No intravenous contrast was administered. COMPARISON:  Single-view of the pelvis 06/08/2017. MRI lumbar spine 11/25/2017. FINDINGS: Bones: The patient has avascular necrosis of the femoral heads bilaterally. Changes are much more severe on the left where there is a greater degree of flattening of subchondral bone and marrow edema in the head and neck of the femur. A milder degree of flattening of subchondral bone is seen on the right. No marrow signal change in the right femoral neck is present. Bone marrow signal is otherwise normal. Articular cartilage and labrum Articular cartilage:  Preserved. Labrum: There is a degenerative tear of the anterior, superior left labrum. The right labrum appears intact. Joint or bursal effusion Joint effusion:  Small effusion on the left is seen. Bursae:  Negative. Muscles and tendons Muscles and tendons:  Intact. Other findings Miscellaneous: Imaged intrapelvic contents demonstrate sigmoid diverticulosis. The patient is status post hysterectomy. IMPRESSION: Avascular necrosis of the femoral heads bilaterally is worse on the left where there is a greater degree of flattening of subchondral bone and marrow edema in the head and neck of the femur consistent with stress change. Small appearing degenerative tear anterior, superior left labrum. Electronically Signed   By: Drusilla Kanner M.D.   On: 12/10/2017 11:10   Dg C-arm 1-60 Min  Result Date: 12/29/2017 CLINICAL  DATA:  Status post anterior approach left total hip joint prosthesis placement. EXAM: OPERATIVE left HIP (WITH PELVIS IF PERFORMED) to VIEWS TECHNIQUE: Fluoroscopic spot image(s) were submitted for interpretation post-operatively. COMPARISON:  Pelvis and left hip series of June 08, 2017 FINDINGS: Reported fluoro time is 39 seconds. The patient has undergone anterior approach left total hip joint prosthesis placement. Radiographic positioning of the prosthetic components is good. Surgical drain lines are present. IMPRESSION: No immediate complication following left total hip joint prosthesis placement. Electronically Signed   By: David  [place] M.D.   On: 12/29/2017 10:11   Dg C-arm 1-60 Min  Result Date: 12/29/2017 CLINICAL DATA:  Status post anterior approach left total hip joint prosthesis placement. EXAM: OPERATIVE left HIP (WITH PELVIS IF PERFORMED) to VIEWS TECHNIQUE: Fluoroscopic spot image(s) were submitted for interpretation post-operatively. COMPARISON:  Pelvis and left hip series of June 08, 2017 FINDINGS: Reported fluoro time is 39 seconds. The patient  has undergone anterior approach left total hip joint prosthesis placement. Radiographic positioning of the prosthetic components is good. Surgical drain lines are present. IMPRESSION: No immediate complication following left total hip joint prosthesis placement. Electronically Signed   By: David  [place] M.D.   On: 12/29/2017 10:11   Dg Hip Port Unilat With Pelvis 1v Left  Result Date: 12/29/2017 CLINICAL DATA:  57 year old female post left hip arthroplasty. Subsequent encounter. EXAM: DG HIP (WITH OR WITHOUT PELVIS) 1V PORT LEFT COMPARISON:  Intraoperative films 12/29/2017.  12/09/2017 MR. FINDINGS: Post total left hip replacement which appears in satisfactory position without complication noted. Right hip avascular necrosis and degenerative changes (better seen on prior MR). IMPRESSION: 1. Post total left hip replacement which appears in  satisfactory position without complication noted. 2. Right hip avascular necrosis and degenerative changes. Electronically Signed   By: Lacy Duverney M.D.   On: 12/29/2017 11:43   Dg Hip Operative Unilat W Or W/o Pelvis Left  Result Date: 12/29/2017 CLINICAL DATA:  Status post anterior approach left total hip joint prosthesis placement. EXAM: OPERATIVE left HIP (WITH PELVIS IF PERFORMED) to VIEWS TECHNIQUE: Fluoroscopic spot image(s) were submitted for interpretation post-operatively. COMPARISON:  Pelvis and left hip series of June 08, 2017 FINDINGS: Reported fluoro time is 39 seconds. The patient has undergone anterior approach left total hip joint prosthesis placement. Radiographic positioning of the prosthetic components is good. Surgical drain lines are present. IMPRESSION: No immediate complication following left total hip joint prosthesis placement. Electronically Signed   By: David  [place] M.D.   On: 12/29/2017 10:11    Disposition: Discharge disposition: 01-Home or Self Care       Discharge Instructions    Call MD / Call 911   Complete by:  As directed    If you experience chest pain or shortness of breath, CALL 911 and be transported to the hospital emergency room.  If you develope a fever above 101 F, pus (white drainage) or increased drainage or redness at the wound, or calf pain, call your surgeon's office.   Constipation Prevention   Complete by:  As directed    Drink plenty of fluids.  Prune juice may be helpful.  You may use a stool softener, such as Colace (over the counter) 100 mg twice a day.  Use MiraLax (over the counter) for constipation as needed.   Diet - low sodium heart healthy   Complete by:  As directed    Discharge instructions   Complete by:  As directed    Okay to be weightbearing as tolerated with walker. Okay to shower.  Dressing waterproof Return to clinic in approximately 10 days for clinical recheck   Increase activity slowly as tolerated   Complete by:   As directed          Signed: Burnard Bunting 12/31/2017, 11:47 AM

## 2017-12-31 NOTE — Progress Notes (Signed)
Subjective: Patient stable.  She is been doing well with therapy.  She walked around the hall today and was in stairwell doing stairs.  Pain is currently controlled.   Objective: Vital signs in last 24 hours: Temp:  [97.7 F (36.5 C)-98 F (36.7 C)] 98 F (36.7 C) (11/07 0433) Pulse Rate:  [93-98] 95 (11/07 1014) Resp:  [16-20] 17 (11/07 1014) BP: (125-151)/(70-80) 134/79 (11/07 1014) SpO2:  [93 %-100 %] 93 % (11/07 1014)  Intake/Output from previous day: 11/06 0701 - 11/07 0700 In: 780 [P.O.:780] Out: 250 [Urine:250] Intake/Output this shift: Total I/O In: 120 [P.O.:120] Out: -   Exam:  Sensation intact distally Dorsiflexion/Plantar flexion intact  Labs: No results for input(s): HGB in the last 72 hours. No results for input(s): WBC, RBC, HCT, PLT in the last 72 hours. No results for input(s): NA, K, CL, CO2, BUN, CREATININE, GLUCOSE, CALCIUM in the last 72 hours. No results for input(s): LABPT, INR in the last 72 hours.  Assessment/Plan: Plan at this time is for her to take a shower.  Okay to discharge to home after that.  Prescriptions have been sent in.  Follow-up with me in 10 days.   Cynthia Pace Cynthia Pace 12/31/2017, 11:40 AM

## 2018-01-07 ENCOUNTER — Encounter (INDEPENDENT_AMBULATORY_CARE_PROVIDER_SITE_OTHER): Payer: Self-pay | Admitting: Orthopedic Surgery

## 2018-01-07 ENCOUNTER — Ambulatory Visit (INDEPENDENT_AMBULATORY_CARE_PROVIDER_SITE_OTHER): Payer: Medicare HMO | Admitting: Orthopedic Surgery

## 2018-01-07 ENCOUNTER — Ambulatory Visit (INDEPENDENT_AMBULATORY_CARE_PROVIDER_SITE_OTHER): Payer: Medicare HMO

## 2018-01-07 DIAGNOSIS — Z96642 Presence of left artificial hip joint: Secondary | ICD-10-CM | POA: Diagnosis not present

## 2018-01-07 MED ORDER — OXYCODONE HCL 5 MG PO CAPS: ORAL_CAPSULE | ORAL | 0 refills | Status: AC

## 2018-01-08 ENCOUNTER — Encounter (INDEPENDENT_AMBULATORY_CARE_PROVIDER_SITE_OTHER): Payer: Self-pay | Admitting: Orthopedic Surgery

## 2018-01-08 NOTE — Progress Notes (Signed)
Post-Op Visit Note   Patient: Cynthia Pace           Date of Birth: 04-24-1960           MRN: [id number] Visit Date: 01/07/2018 PCP: Gwenlyn Found, MD   Assessment & Plan:  Chief Complaint:  Chief Complaint  Patient presents with  . Left Hip - Routine Post Op   Visit Diagnoses:  1. Status post total replacement of left hip     Plan: Orena is a patient who is now 2 weeks out left total hip replacement.  She is been doing well.  On exam she is ambulating with a walker.  She has pretty good hip flexion strength abduction and adduction strength.  Radiographs look good.  Incision intact.  Plan at this time is to refill oxycodone and continue with home health PT to transition outpatient PT.  4-week return for clinical recheck.  Follow-Up Instructions: Return in about 4 weeks (around 02/04/2018).   Orders:  Orders Placed This Encounter  Procedures  . XR HIP UNILAT W OR W/O PELVIS 2-3 VIEWS LEFT   Meds ordered this encounter  Medications  . oxycodone (OXY-IR) 5 MG capsule    Sig: 1-2 po q 4-6hrs prn pain    Dispense:  45 capsule    Refill:  0    Imaging: No results found.  PMFS History: Patient Active Problem List   Diagnosis Date Noted  . Hip arthritis 12/29/2017  . Lumbar pain 06/08/2017  . Shoulder fracture, left 05/03/2015   Past Medical History:  Diagnosis Date  . Arthritis    "left ankle" (05/04/2015)  . Asthma   . Carpal tunnel syndrome   . Chronic bronchitis (HCC)   . Chronic lower back pain   . Elevated liver enzymes   . Environmental allergies   . Family history of adverse reaction to anesthesia    "daughter died after 2nd epidural for C-section; they were able to bring her back"  . GERD (gastroesophageal reflux disease)   . History of kidney stones   . History of TMJ syndrome   . Hyperlipidemia   . Hypertension   . Kidney stones   . Restless leg syndrome   . Seasonal allergies     Family History  Problem Relation Age of Onset  .  Tuberculosis Mother   . Heart disease Mother   . Cancer Father     Past Surgical History:  Procedure Laterality Date  . ABDOMINAL HYSTERECTOMY    . ANKLE SURGERY Left    severed artery and tendons  . APPENDECTOMY    . CYSTOSCOPY W/ STONE MANIPULATION  X 1  . FRACTURE SURGERY    . JOINT REPLACEMENT    . ORIF HUMERUS FRACTURE Left 05/03/2015   Procedure: OPEN REDUCTION INTERNAL FIXATION (ORIF) LEFT PROXIMAL HUMERUS FRACTURE NONUNION;  Surgeon: Cammy Copa, MD;  Location: [facility] OR;  Service: Orthopedics;  Laterality: Left;  . TONSILLECTOMY    . TOTAL HIP ARTHROPLASTY Left 12/29/2017  . TOTAL HIP ARTHROPLASTY Left 12/29/2017   Procedure: LEFT TOTAL HIP ARTHROPLASTY ANTERIOR APPROACH;  Surgeon: Cammy Copa, MD;  Location: [facility] OR;  Service: Orthopedics;  Laterality: Left;   Social History   Occupational History  . Not on file  Tobacco Use  . Smoking status: Current Every Day Smoker    Packs/day: 1.00    Years: 18.00    Pack years: 18.00    Types: Cigarettes  . Smokeless tobacco: Never Used  Substance  and Sexual Activity  . Alcohol use: Yes    Alcohol/week: 1.0 - 2.0 standard drinks    Types: 1 - 2 Glasses of wine per week  . Drug use: No  . Sexual activity: Not Currently

## 2018-01-14 ENCOUNTER — Telehealth (INDEPENDENT_AMBULATORY_CARE_PROVIDER_SITE_OTHER): Payer: Self-pay | Admitting: Orthopedic Surgery

## 2018-01-14 NOTE — Telephone Encounter (Signed)
Patient called to request an RX refill on her Oxycodone.  CB#[phone].  Thank you.

## 2018-01-15 MED ORDER — OXYCODONE HCL 5 MG PO TABS: ORAL_TABLET | ORAL | 0 refills | Status: AC

## 2018-01-15 NOTE — Telephone Encounter (Signed)
Ok to rf? Received #45 on 11/14.

## 2018-01-15 NOTE — Telephone Encounter (Signed)
IC advised could pick up at front desk.  

## 2018-01-15 NOTE — Telephone Encounter (Signed)
y

## 2018-01-18 ENCOUNTER — Telehealth (INDEPENDENT_AMBULATORY_CARE_PROVIDER_SITE_OTHER): Payer: Self-pay

## 2018-01-18 ENCOUNTER — Inpatient Hospital Stay (INDEPENDENT_AMBULATORY_CARE_PROVIDER_SITE_OTHER): Payer: Medicare HMO | Admitting: Orthopedic Surgery

## 2018-01-18 DIAGNOSIS — M87052 Idiopathic aseptic necrosis of left femur: Secondary | ICD-10-CM

## 2018-01-18 NOTE — Telephone Encounter (Signed)
Tammy with [facility] would like for Operative Note and last Office Note faxed to [phone].  CB# is [phone].  Please advise.  Thank You.

## 2018-01-19 NOTE — Telephone Encounter (Signed)
Faxed to provided number  

## 2018-02-08 ENCOUNTER — Ambulatory Visit (INDEPENDENT_AMBULATORY_CARE_PROVIDER_SITE_OTHER): Payer: Medicare HMO | Admitting: Orthopedic Surgery

## 2018-02-08 ENCOUNTER — Encounter (INDEPENDENT_AMBULATORY_CARE_PROVIDER_SITE_OTHER): Payer: Self-pay | Admitting: Orthopedic Surgery

## 2018-02-08 DIAGNOSIS — Z96642 Presence of left artificial hip joint: Secondary | ICD-10-CM

## 2018-02-10 ENCOUNTER — Encounter (INDEPENDENT_AMBULATORY_CARE_PROVIDER_SITE_OTHER): Payer: Self-pay | Admitting: Orthopedic Surgery

## 2018-02-10 NOTE — Progress Notes (Signed)
Post-Op Visit Note   Patient: Cynthia Pace           Date of Birth: 1960/10/27           MRN: [id number] Visit Date: 02/08/2018 PCP: Gwenlyn Found, MD   Assessment & Plan:  Chief Complaint:  Chief Complaint  Patient presents with  . Left Hip - Routine Post Op   Visit Diagnoses:  1. Status post total replacement of left hip     Plan: Nkechi is now 6 weeks out left total hip replacement.  Doing well with the hip.  Developed some restless leg syndrome.  No assistive devices.  She is doing therapy.  On exam she has no pain with range of motion of that left hip and improving hip flexion strength.  Leg lengths are equal.  She is having little bit of low back pain which we can work-up next clinic visit.  For now continue with therapy and range of motion exercises follow-up with me in 6 weeks.  Follow-Up Instructions: Return in about 6 weeks (around 03/22/2018).   Orders:  No orders of the defined types were placed in this encounter.  No orders of the defined types were placed in this encounter.   Imaging: No results found.  PMFS History: Patient Active Problem List   Diagnosis Date Noted  . Avascular necrosis of bone of left hip (HCC)   . Hip arthritis 12/29/2017  . Lumbar pain 06/08/2017  . Shoulder fracture, left 05/03/2015   Past Medical History:  Diagnosis Date  . Arthritis    "left ankle" (05/04/2015)  . Asthma   . Carpal tunnel syndrome   . Chronic bronchitis (HCC)   . Chronic lower back pain   . Elevated liver enzymes   . Environmental allergies   . Family history of adverse reaction to anesthesia    "daughter died after 2nd epidural for C-section; they were able to bring her back"  . GERD (gastroesophageal reflux disease)   . History of kidney stones   . History of TMJ syndrome   . Hyperlipidemia   . Hypertension   . Kidney stones   . Restless leg syndrome   . Seasonal allergies     Family History  Problem Relation Age of Onset  . Tuberculosis  Mother   . Heart disease Mother   . Cancer Father     Past Surgical History:  Procedure Laterality Date  . ABDOMINAL HYSTERECTOMY    . ANKLE SURGERY Left    severed artery and tendons  . APPENDECTOMY    . CYSTOSCOPY W/ STONE MANIPULATION  X 1  . FRACTURE SURGERY    . JOINT REPLACEMENT    . ORIF HUMERUS FRACTURE Left 05/03/2015   Procedure: OPEN REDUCTION INTERNAL FIXATION (ORIF) LEFT PROXIMAL HUMERUS FRACTURE NONUNION;  Surgeon: Cammy Copa, MD;  Location: [facility] OR;  Service: Orthopedics;  Laterality: Left;  . TONSILLECTOMY    . TOTAL HIP ARTHROPLASTY Left 12/29/2017  . TOTAL HIP ARTHROPLASTY Left 12/29/2017   Procedure: LEFT TOTAL HIP ARTHROPLASTY ANTERIOR APPROACH;  Surgeon: Cammy Copa, MD;  Location: [facility] OR;  Service: Orthopedics;  Laterality: Left;   Social History   Occupational History  . Not on file  Tobacco Use  . Smoking status: Current Every Day Smoker    Packs/day: 1.00    Years: 18.00    Pack years: 18.00    Types: Cigarettes  . Smokeless tobacco: Never Used  Substance and Sexual Activity  . Alcohol  use: Yes    Alcohol/week: 1.0 - 2.0 standard drinks    Types: 1 - 2 Glasses of wine per week  . Drug use: No  . Sexual activity: Not Currently

## 2018-03-22 ENCOUNTER — Ambulatory Visit (INDEPENDENT_AMBULATORY_CARE_PROVIDER_SITE_OTHER): Payer: Medicare HMO | Admitting: Orthopedic Surgery

## 2018-03-22 ENCOUNTER — Telehealth (INDEPENDENT_AMBULATORY_CARE_PROVIDER_SITE_OTHER): Payer: Self-pay | Admitting: Orthopedic Surgery

## 2018-03-22 ENCOUNTER — Encounter (INDEPENDENT_AMBULATORY_CARE_PROVIDER_SITE_OTHER): Payer: Self-pay | Admitting: Orthopedic Surgery

## 2018-03-22 DIAGNOSIS — Z96642 Presence of left artificial hip joint: Secondary | ICD-10-CM

## 2018-03-22 NOTE — Progress Notes (Signed)
Post-Op Visit Note   Patient: Cynthia Pace           Date of Birth: [birth year]           MRN: [id number] Visit Date: 03/22/2018 PCP: Gwenlyn Found, MD   Assessment & Plan:  Chief Complaint:  Chief Complaint  Patient presents with  . Left Hip - Pain   Visit Diagnoses:  1. Status post total replacement of left hip     Plan: [name] is a patient with left hip replacement 12/29/2017.  She has been doing reasonably well but had recurrence of some pain and stiffness over the past 3 weeks.  She did play golf about 3 weeks ago.  She is doing physical therapy.  On exam she has excellent hip flexion and adduction strength and good leg lengths.  She does have a history of bronchitis and she was on antibiotics and that has happened since her surgery.  Slight concern about possible infection based on that history but we can watch this for now.  She not having any fevers or chills.  I want to see her back in 6 weeks and will consider repeat x-ray at that time if clinically she is not improved.  Would like for her not to play golf during that time.  As well  Follow-Up Instructions: Return in about 6 weeks (around 05/03/2018).   Orders:  No orders of the defined types were placed in this encounter.  No orders of the defined types were placed in this encounter.   Imaging: No results found.  PMFS History: Patient Active Problem List   Diagnosis Date Noted  . Avascular necrosis of bone of left hip (HCC)   . Hip arthritis 12/29/2017  . Lumbar pain 06/08/2017  . Shoulder fracture, left 05/03/2015   Past Medical History:  Diagnosis Date  . Arthritis    "left ankle" (05/04/2015)  . Asthma   . Carpal tunnel syndrome   . Chronic bronchitis (HCC)   . Chronic lower back pain   . Elevated liver enzymes   . Environmental allergies   . Family history of adverse reaction to anesthesia    "daughter died after 2nd epidural for C-section; they were able to bring her back"  . GERD  (gastroesophageal reflux disease)   . History of kidney stones   . History of TMJ syndrome   . Hyperlipidemia   . Hypertension   . Kidney stones   . Restless leg syndrome   . Seasonal allergies     Family History  Problem Relation Age of Onset  . Tuberculosis Mother   . Heart disease Mother   . Cancer Father     Past Surgical History:  Procedure Laterality Date  . ABDOMINAL HYSTERECTOMY    . ANKLE SURGERY Left    severed artery and tendons  . APPENDECTOMY    . CYSTOSCOPY W/ STONE MANIPULATION  X 1  . FRACTURE SURGERY    . JOINT REPLACEMENT    . ORIF HUMERUS FRACTURE Left 05/03/2015   Procedure: OPEN REDUCTION INTERNAL FIXATION (ORIF) LEFT PROXIMAL HUMERUS FRACTURE NONUNION;  Surgeon: Cammy Copa, MD;  Location: [facility] OR;  Service: Orthopedics;  Laterality: Left;  . TONSILLECTOMY    . TOTAL HIP ARTHROPLASTY Left 12/29/2017  . TOTAL HIP ARTHROPLASTY Left 12/29/2017   Procedure: LEFT TOTAL HIP ARTHROPLASTY ANTERIOR APPROACH;  Surgeon: Cammy Copa, MD;  Location: [facility] OR;  Service: Orthopedics;  Laterality: Left;   Social History   Occupational History  .  Not on file  Tobacco Use  . Smoking status: Current Every Day Smoker    Packs/day: 1.00    Years: 18.00    Pack years: 18.00    Types: Cigarettes  . Smokeless tobacco: Never Used  Substance and Sexual Activity  . Alcohol use: Yes    Alcohol/week: 1.0 - 2.0 standard drinks    Types: 1 - 2 Glasses of wine per week  . Drug use: No  . Sexual activity: Not Currently

## 2018-03-22 NOTE — Telephone Encounter (Signed)
Patient asked if Dr. August Saucer can write her a Rx to continue (PT)    The number to contact patient is [phone]

## 2018-03-22 NOTE — Telephone Encounter (Signed)
Okay to continue PT 1 time a week for 6 more weeks for strengthening and range of motion and balance exercises

## 2018-03-22 NOTE — Telephone Encounter (Signed)
Please advise 

## 2018-03-23 NOTE — Telephone Encounter (Signed)
I called to advise. Patient was asleep. Gentleman who answered the phone will let her know to return my call today.

## 2018-03-25 ENCOUNTER — Telehealth (INDEPENDENT_AMBULATORY_CARE_PROVIDER_SITE_OTHER): Payer: Self-pay | Admitting: Orthopaedic Surgery

## 2018-03-25 NOTE — Telephone Encounter (Signed)
Patient received message that you called. She  went to PT yesterday and no updated or extended order had been sent in. She wanted to know if Dr. August Saucer sent that in?  Please call patient to advise. [phone]

## 2018-03-25 NOTE — Telephone Encounter (Signed)
I called patient again with no answer. If patient returns call, please find out if she is still attending PT at [facility] so I know where to send her extended PT referral. Thanks.

## 2018-03-26 NOTE — Telephone Encounter (Signed)
I spoke with patient. She is attending PT at [facility].  Script faxed.

## 2018-03-26 NOTE — Telephone Encounter (Signed)
I spoke with patient. Rx sent to [facility] PT.

## 2018-03-26 NOTE — Addendum Note (Signed)
Addended by: Rogers Seeds on: 03/26/2018 03:58 PM   Modules accepted: Orders

## 2018-04-05 ENCOUNTER — Telehealth (INDEPENDENT_AMBULATORY_CARE_PROVIDER_SITE_OTHER): Payer: Self-pay | Admitting: Orthopedic Surgery

## 2018-04-05 NOTE — Telephone Encounter (Signed)
Patient lmom requesting a call back, she states she's having numbness & tingling and wasn't sure if this should be going on with her being months out from surgery.

## 2018-04-06 NOTE — Telephone Encounter (Signed)
Please advise 

## 2018-04-06 NOTE — Telephone Encounter (Signed)
Patient aware of the below message  

## 2018-04-06 NOTE — Telephone Encounter (Signed)
This may be coming from back or it may be some nerves that are waking back up that her sensory nerves X how to give it a couple weeks and then if it still bothering her come in at that point

## 2018-04-06 NOTE — Telephone Encounter (Signed)
Please call.

## 2018-05-05 ENCOUNTER — Ambulatory Visit (INDEPENDENT_AMBULATORY_CARE_PROVIDER_SITE_OTHER): Payer: Medicare HMO | Admitting: Orthopedic Surgery

## 2018-05-12 ENCOUNTER — Ambulatory Visit (INDEPENDENT_AMBULATORY_CARE_PROVIDER_SITE_OTHER): Payer: Medicare HMO | Admitting: Orthopedic Surgery

## 2018-05-12 ENCOUNTER — Encounter (INDEPENDENT_AMBULATORY_CARE_PROVIDER_SITE_OTHER): Payer: Self-pay | Admitting: Orthopedic Surgery

## 2018-05-12 ENCOUNTER — Ambulatory Visit (INDEPENDENT_AMBULATORY_CARE_PROVIDER_SITE_OTHER): Payer: Medicare HMO

## 2018-05-12 ENCOUNTER — Other Ambulatory Visit: Payer: Self-pay

## 2018-05-12 DIAGNOSIS — Z96642 Presence of left artificial hip joint: Secondary | ICD-10-CM

## 2018-05-12 NOTE — Progress Notes (Signed)
Office Visit Note   Patient: Cynthia Pace           Date of Birth: [birth year]           MRN: [id number] Visit Date: 05/12/2018 Requested by: Gwenlyn Found, MD [id number] [place], [place] [address] PCP: Gwenlyn Found, MD  Subjective: Chief Complaint  Patient presents with  . Left Hip - Follow-up    HPI: Yarelie is a patient with left total hip replacement now about 4 months out.  She is doing well with that.  Having some bronchitis and she is requesting a chest x-ray but since I do not really interpret those I have have written prescription for that.  Having some occasional pulling but in general she is walking for exercise.  Also in physical therapy for her back.              ROS: All systems reviewed are negative as they relate to the chief complaint within the history of present illness.  Patient denies  fevers or chills.   Assessment & Plan: Visit Diagnoses:  1. Status post total replacement of left hip     Plan: Impression is left hip replacement doing well.  Right hip AVN looks stable with no real symptoms at this time.  Back is also stable.  Continue with physical therapy for the back for now.  I will write a prescription for chest x-ray.  Follow-up with me as needed.  I think in terms of returning to golf I like her to spend 1 day 3 times a week working up from short irons to long irons.  I think then she should go with nine-hole short part 3 type play and then build up to nine-hole regular play and then 18-hole regular play.  I will see her back as needed..  Follow-Up Instructions: Return if symptoms worsen or fail to improve.   Orders:  Orders Placed This Encounter  Procedures  . XR HIP UNILAT W OR W/O PELVIS 2-3 VIEWS LEFT   No orders of the defined types were placed in this encounter.     Procedures: No procedures performed   Clinical Data: No additional findings.  Objective: Vital Signs: There were no vitals taken for this visit.   Physical Exam:   Constitutional: Patient appears well-developed HEENT:  Head: Normocephalic Eyes:EOM are normal Neck: Normal range of motion Cardiovascular: Normal rate Pulmonary/chest: Effort normal Neurologic: Patient is alert Skin: Skin is warm Psychiatric: Patient has normal mood and affect    Ortho Exam: Ortho exam demonstrates no groin pain with internal X rotation of either leg.  Leg lengths equal.  Patient has excellent hip flexion abduction adduction strength with palpable pedal pulses.  Specialty Comments:  No specialty comments available.  Imaging: Xr Hip Unilat W Or W/o Pelvis 2-3 Views Left  Result Date: 05/12/2018 AP pelvis lateral left hip reviewed.  Total hip prosthesis in good position alignment with no complicating features.  No fracture or evidence of lucency around any of the hardware.  Right hip which also has known avascular necrosis shows good sphericity and no collapse.    PMFS History: Patient Active Problem List   Diagnosis Date Noted  . Avascular necrosis of bone of left hip (HCC)   . Hip arthritis 12/29/2017  . Lumbar pain 06/08/2017  . Shoulder fracture, left 05/03/2015   Past Medical History:  Diagnosis Date  . Arthritis    "left ankle" (05/04/2015)  . Asthma   .  Carpal tunnel syndrome   . Chronic bronchitis (HCC)   . Chronic lower back pain   . Elevated liver enzymes   . Environmental allergies   . Family history of adverse reaction to anesthesia    "daughter died after 2nd epidural for C-section; they were able to bring her back"  . GERD (gastroesophageal reflux disease)   . History of kidney stones   . History of TMJ syndrome   . Hyperlipidemia   . Hypertension   . Kidney stones   . Restless leg syndrome   . Seasonal allergies     Family History  Problem Relation Age of Onset  . Tuberculosis Mother   . Heart disease Mother   . Cancer Father     Past Surgical History:  Procedure Laterality Date  . ABDOMINAL HYSTERECTOMY     . ANKLE SURGERY Left    severed artery and tendons  . APPENDECTOMY    . CYSTOSCOPY W/ STONE MANIPULATION  X 1  . FRACTURE SURGERY    . JOINT REPLACEMENT    . ORIF HUMERUS FRACTURE Left 05/03/2015   Procedure: OPEN REDUCTION INTERNAL FIXATION (ORIF) LEFT PROXIMAL HUMERUS FRACTURE NONUNION;  Surgeon: Cammy Copa, MD;  Location: [facility] OR;  Service: Orthopedics;  Laterality: Left;  . TONSILLECTOMY    . TOTAL HIP ARTHROPLASTY Left 12/29/2017  . TOTAL HIP ARTHROPLASTY Left 12/29/2017   Procedure: LEFT TOTAL HIP ARTHROPLASTY ANTERIOR APPROACH;  Surgeon: Cammy Copa, MD;  Location: [facility] OR;  Service: Orthopedics;  Laterality: Left;   Social History   Occupational History  . Not on file  Tobacco Use  . Smoking status: Current Every Day Smoker    Packs/day: 1.00    Years: 18.00    Pack years: 18.00    Types: Cigarettes  . Smokeless tobacco: Never Used  Substance and Sexual Activity  . Alcohol use: Yes    Alcohol/week: 1.0 - 2.0 standard drinks    Types: 1 - 2 Glasses of wine per week  . Drug use: No  . Sexual activity: Not Currently

## 2018-08-04 ENCOUNTER — Ambulatory Visit
Admission: RE | Admit: 2018-08-04 | Discharge: 2018-08-04 | Disposition: A | Discharge status: Home / Self Care | Payer: Medicare HMO | Source: Ambulatory Visit | Attending: Family Medicine | Admitting: Family Medicine

## 2018-08-04 ENCOUNTER — Other Ambulatory Visit: Payer: Self-pay | Admitting: Family Medicine

## 2018-08-04 ENCOUNTER — Other Ambulatory Visit: Payer: Self-pay

## 2018-08-04 DIAGNOSIS — R059 Cough, unspecified: Secondary | ICD-10-CM

## 2018-08-04 DIAGNOSIS — R05 Cough: Secondary | ICD-10-CM

## 2019-04-13 ENCOUNTER — Ambulatory Visit: Payer: Medicare HMO | Admitting: Orthopedic Surgery

## 2019-06-05 IMAGING — MR MR PELVIS W/O CM
4 of 5 series · 17 of 48 positions shown · non-contrast
Comparison: Single-view of the pelvis 06/08/2017. MRI lumbar spine
11/25/2017.

CLINICAL DATA: Left hip pain for several months.  No known injury.

EXAM:
MR OF THE BILATERAL HIPS WITHOUT CONTRAST
TECHNIQUE: Multiplanar, multisequence MR imaging was performed. No intravenous
contrast was administered.

[Series 3: STIR · coronal · 4.0mm · 0.78mm/px · 7 of 27 slices shown]
[im 1/27]
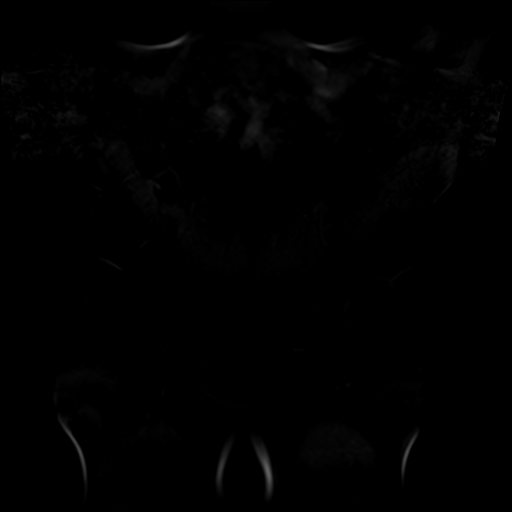
[im 5/27]
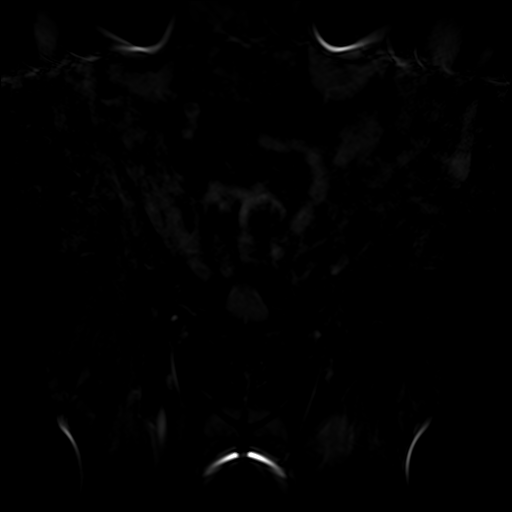
[im 9/27]
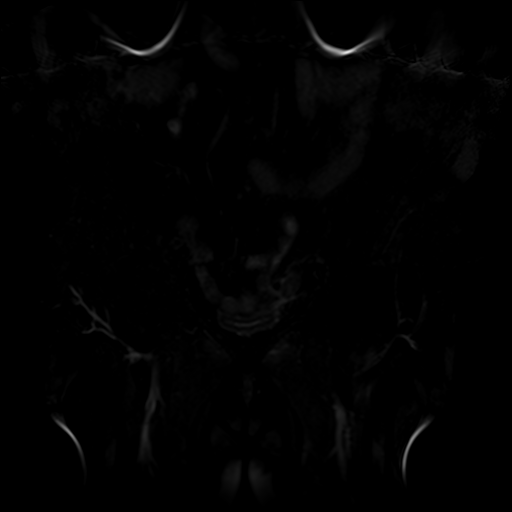
[im 14/27]
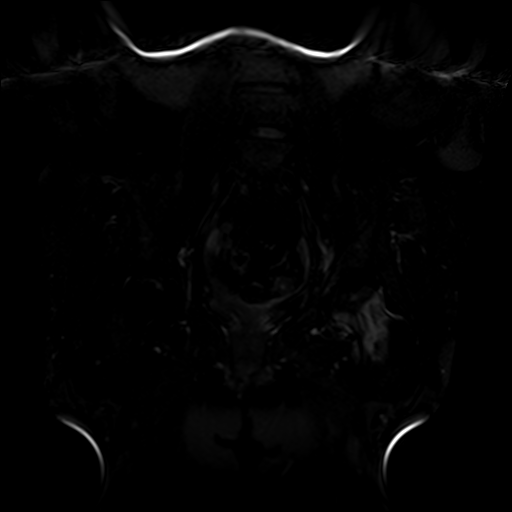
[im 18/27]
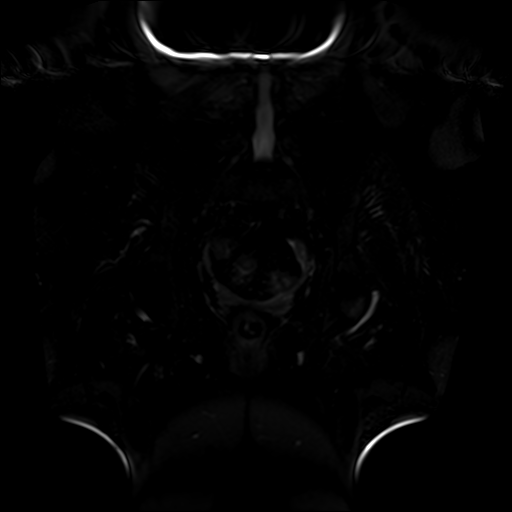
[im 22/27]
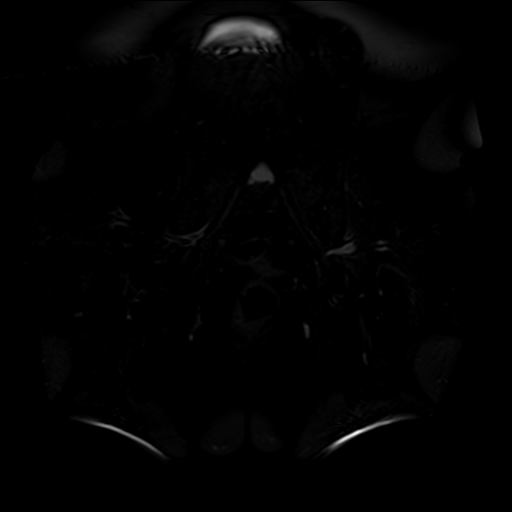
[im 27/27]
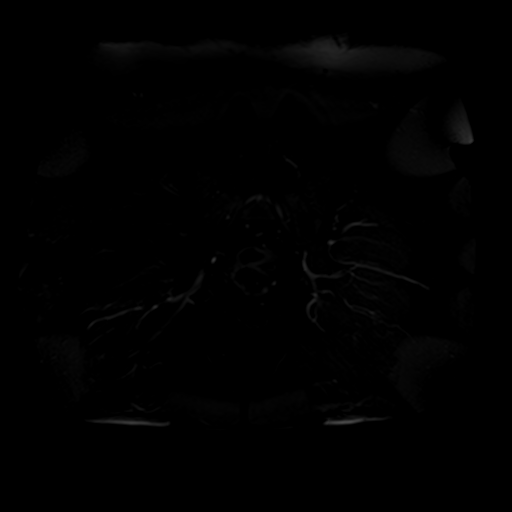

[Series 4: T1 · coronal · 4.0mm · 0.62mm/px · 4 of 28 slices shown (1 of 2)]
[im 1/28]
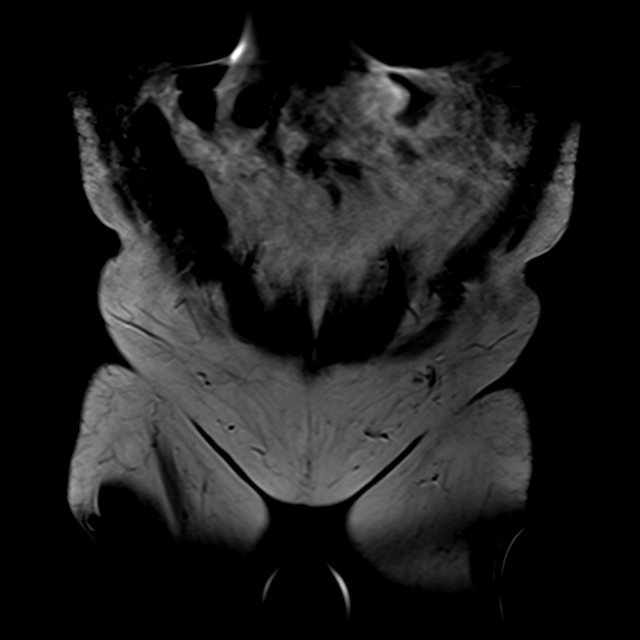
[im 6/28]
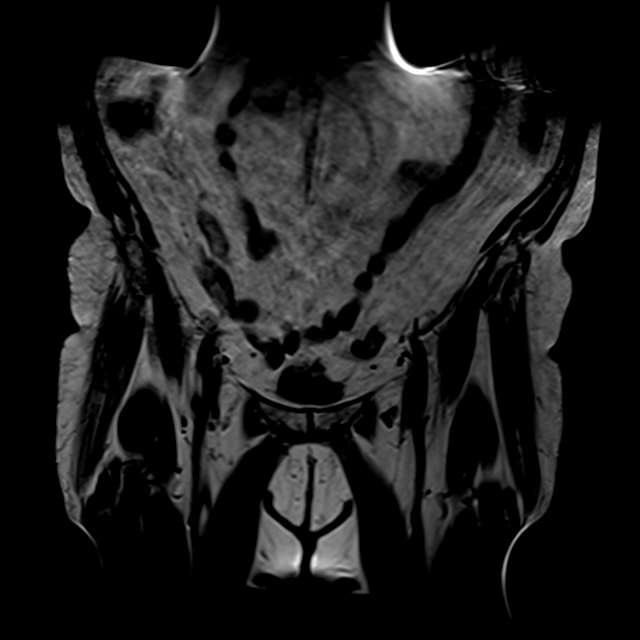
[im 17/28]
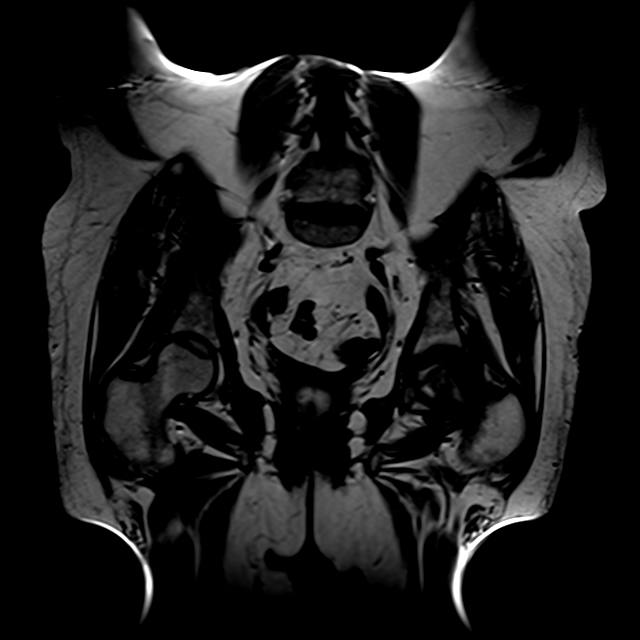
[im 28/28]
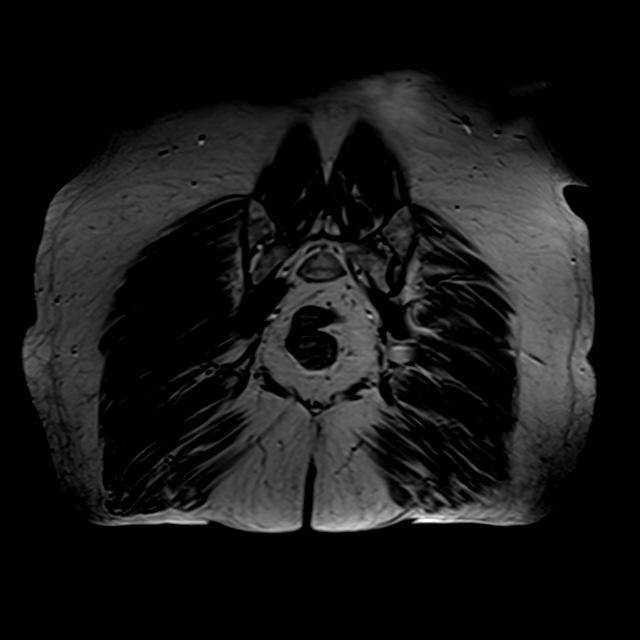

[Series 5: T1 · axial · 4.0mm · 0.64mm/px · z∈[-109,+91]mm · 3 of 50 slices shown (2 of 2)]
[im 5/50]
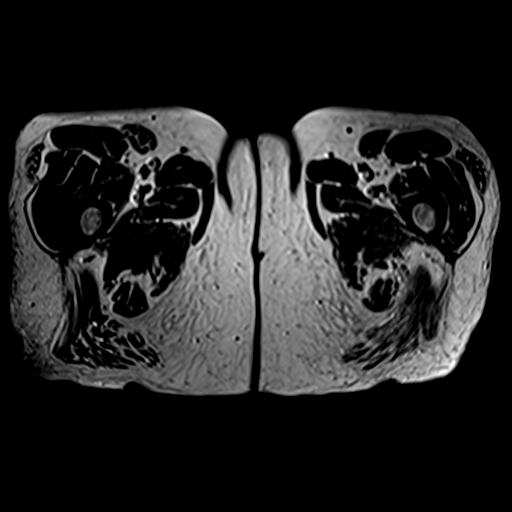
[im 25/50]
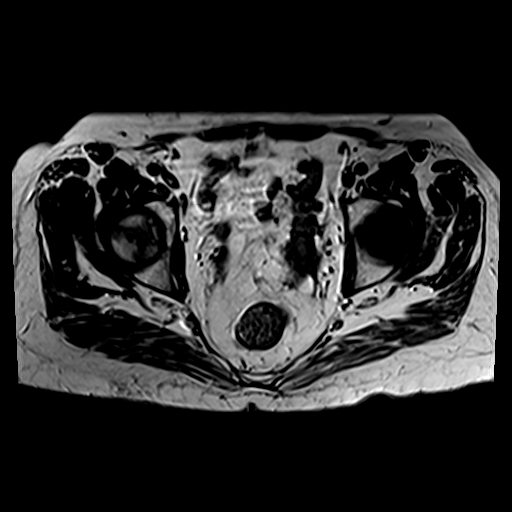
[im 45/50]
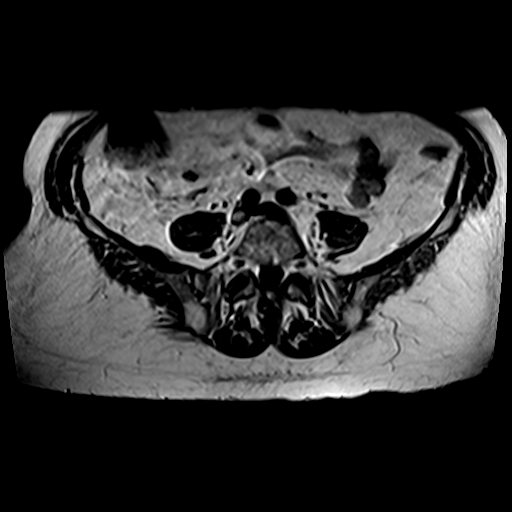

[Series 6: T2 fat-sat · axial · 4.0mm · 0.64mm/px · z∈[-109,+91]mm · 3 of 50 slices shown]
[im 5/50]
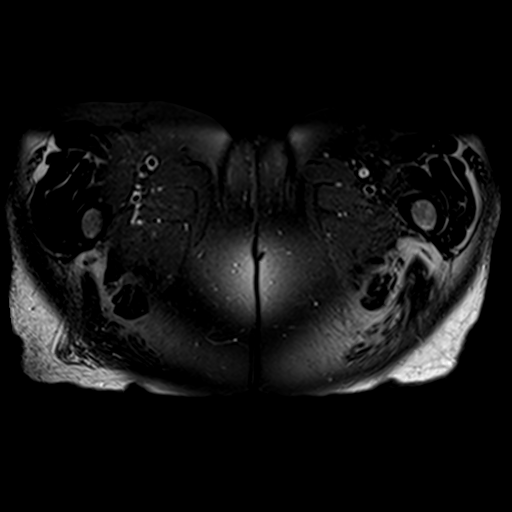
[im 25/50]
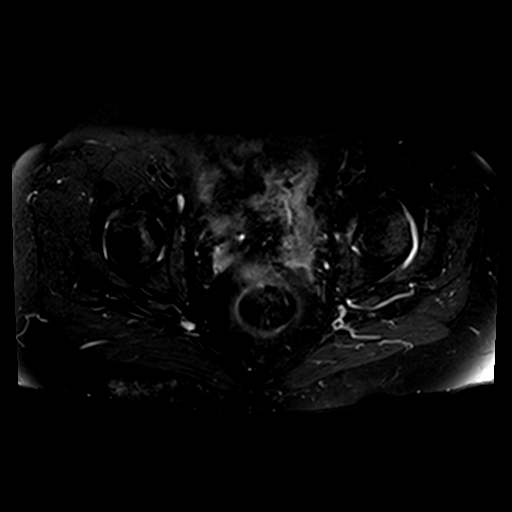
[im 45/50]
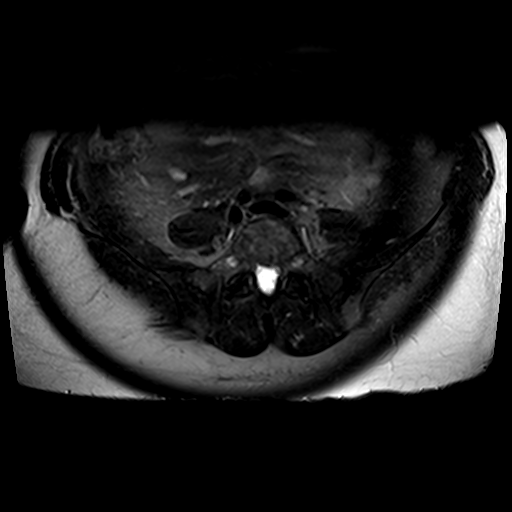

[17 of 48 positions shown; findings below may reference images not displayed]

FINDINGS: Bones: The patient has avascular necrosis of the femoral heads
bilaterally. Changes are much more severe on the left where there is
a greater degree of flattening of subchondral bone and marrow edema
in the head and neck of the femur. A milder degree of flattening of
subchondral bone is seen on the right. No marrow signal change in
the right femoral neck is present. Bone marrow signal is otherwise
normal.

Articular cartilage and labrum

Articular cartilage:  Preserved.

Labrum: There is a degenerative tear of the anterior, superior left
labrum. The right labrum appears intact.

Joint or bursal effusion

Joint effusion:  Small effusion on the left is seen.

Bursae:  Negative.

Muscles and tendons

Muscles and tendons:  Intact.

Other findings

Miscellaneous: Imaged intrapelvic contents demonstrate sigmoid
diverticulosis. The patient is status post hysterectomy.
IMPRESSION: Avascular necrosis of the femoral heads bilaterally is worse on the
left where there is a greater degree of flattening of subchondral
bone and marrow edema in the head and neck of the femur consistent
with stress change.

Small appearing degenerative tear anterior, superior left labrum.

## 2019-06-15 ENCOUNTER — Other Ambulatory Visit: Payer: Self-pay | Admitting: Family Medicine

## 2019-06-15 DIAGNOSIS — I871 Compression of vein: Secondary | ICD-10-CM

## 2019-06-15 DIAGNOSIS — R1011 Right upper quadrant pain: Secondary | ICD-10-CM

## 2019-06-20 ENCOUNTER — Other Ambulatory Visit: Payer: Medicare HMO

## 2019-06-21 ENCOUNTER — Ambulatory Visit
Admission: RE | Admit: 2019-06-21 | Discharge: 2019-06-21 | Disposition: A | Discharge status: Home / Self Care | Payer: Medicare HMO | Source: Ambulatory Visit | Attending: Family Medicine | Admitting: Family Medicine

## 2019-06-21 DIAGNOSIS — R1011 Right upper quadrant pain: Secondary | ICD-10-CM

## 2019-06-21 DIAGNOSIS — I871 Compression of vein: Secondary | ICD-10-CM

## 2019-07-14 ENCOUNTER — Telehealth: Payer: Self-pay | Admitting: Orthopedic Surgery

## 2019-07-14 NOTE — Telephone Encounter (Signed)
Tried calling, no answer. VM picked up but was unable to LM as the VM greeting was on repeat and never had opportunity to LM. Will try again later.

## 2019-07-14 NOTE — Telephone Encounter (Signed)
Received call from Diane with [facility] she asked if patient need antibiotics before any dental procedure? Diane said patient was seen as a new patient today. The number to contact Sedalia Muta is [phone]  Diane said if she is not available ask to speak with Dianna    The number to contact Diane is [phone]

## 2019-07-15 NOTE — Telephone Encounter (Signed)
Tried calling again. VM picked up but was unable to LM.

## 2021-01-28 ENCOUNTER — Other Ambulatory Visit: Payer: Self-pay | Admitting: Family Medicine

## 2021-01-28 DIAGNOSIS — K709 Alcoholic liver disease, unspecified: Secondary | ICD-10-CM

## 2021-01-28 DIAGNOSIS — F1020 Alcohol dependence, uncomplicated: Secondary | ICD-10-CM

## 2021-02-13 ENCOUNTER — Other Ambulatory Visit: Payer: Medicare HMO

## 2021-02-27 ENCOUNTER — Other Ambulatory Visit: Payer: Medicare HMO

## 2021-03-05 ENCOUNTER — Other Ambulatory Visit: Payer: Medicare HMO

## 2021-03-19 ENCOUNTER — Ambulatory Visit
Admission: RE | Admit: 2021-03-19 | Discharge: 2021-03-19 | Disposition: A | Discharge status: Home / Self Care | Payer: Medicare HMO | Source: Ambulatory Visit | Attending: Family Medicine | Admitting: Family Medicine

## 2021-03-19 DIAGNOSIS — K709 Alcoholic liver disease, unspecified: Secondary | ICD-10-CM

## 2021-03-19 DIAGNOSIS — F1020 Alcohol dependence, uncomplicated: Secondary | ICD-10-CM

## 2021-05-19 ENCOUNTER — Emergency Department (HOSPITAL_BASED_OUTPATIENT_CLINIC_OR_DEPARTMENT_OTHER): Payer: Medicare HMO

## 2021-05-19 ENCOUNTER — Encounter (HOSPITAL_BASED_OUTPATIENT_CLINIC_OR_DEPARTMENT_OTHER): Payer: Self-pay

## 2021-05-19 ENCOUNTER — Other Ambulatory Visit: Payer: Self-pay

## 2021-05-19 ENCOUNTER — Emergency Department (HOSPITAL_BASED_OUTPATIENT_CLINIC_OR_DEPARTMENT_OTHER)
Admission: EM | Admit: 2021-05-19 | Discharge: 2021-05-19 | Disposition: A | Discharge status: Home / Self Care | Payer: Medicare HMO | Attending: Emergency Medicine | Admitting: Emergency Medicine

## 2021-05-19 DIAGNOSIS — M542 Cervicalgia: Secondary | ICD-10-CM | POA: Diagnosis not present

## 2021-05-19 DIAGNOSIS — Z7982 Long term (current) use of aspirin: Secondary | ICD-10-CM | POA: Insufficient documentation

## 2021-05-19 DIAGNOSIS — W2104XA Struck by golf ball, initial encounter: Secondary | ICD-10-CM | POA: Insufficient documentation

## 2021-05-19 DIAGNOSIS — S0101XA Laceration without foreign body of scalp, initial encounter: Secondary | ICD-10-CM | POA: Diagnosis not present

## 2021-05-19 DIAGNOSIS — Y9353 Activity, golf: Secondary | ICD-10-CM | POA: Insufficient documentation

## 2021-05-19 DIAGNOSIS — S0990XA Unspecified injury of head, initial encounter: Secondary | ICD-10-CM | POA: Diagnosis present

## 2021-05-19 NOTE — ED Triage Notes (Signed)
She states she was struck by a golf ball today. She was struck at left temple area, at which she has an area of bleeding. I am unable to visualize the wound d/t the congealed blood. She denies l.o.c. and tells me she was "dizzy for a little bit when it happened".  ?

## 2021-05-19 NOTE — ED Notes (Signed)
Patient transported to CT 

## 2021-05-19 NOTE — Discharge Instructions (Signed)
Return if any problems.

## 2021-05-19 NOTE — ED Notes (Signed)
EMT-P provided AVS using Teachback Method. Patient verbalizes understanding of Discharge Instructions. Opportunity for Questioning and Answers were provided by EMT-P. Patient Discharged from ED.  ? ?

## 2021-05-23 NOTE — ED Provider Notes (Signed)
?[facility] EMERGENCY DEPT ?Provider Note ? ? ?CSN: [id number] ?Arrival date & time: 05/19/21  1417 ? ?  ? ?History ? ?Chief Complaint  ?Patient presents with  ? Head Laceration  ? ? ?Cynthia Pace is a 61 y.o. female. ? ?Patient reports that she was playing golf when a golf ball struck her in the head.  Patient reports it was a hard impact.  Patient reports she had a significant amount of bleeding from the site.  Patient complains of a swollen tender area on her scalp.  Patient reports alcohol consumption before injury.  She complains of headache and dizziness.  Patient has had some soreness in her neck.  Patient reports no pain with moving head side to side patient reports she did not lose consciousness.  She has not had any vomiting she denies any visual changes or hearing changes ? ?The history is provided by the patient. No language interpreter was used.  ?Head Laceration ?This is a new problem. The current episode started less than 1 hour ago. The problem occurs constantly. The problem has not changed since onset.Nothing aggravates the symptoms. She has tried nothing for the symptoms. The treatment provided no relief.  ? ?  ? ?Home Medications ?Prior to Admission medications   ?Medication Sig Start Date End Date Taking? Authorizing Provider  ?acetaminophen (TYLENOL) 500 MG tablet 1 po bid prn 12/03/17   Cammy Copa, MD  ?ADVAIR DISKUS 250-50 MCG/DOSE AEPB Inhale 1 puff into the lungs daily.     [provider]  ?albuterol (PROVENTIL HFA;VENTOLIN HFA) 108 (90 Base) MCG/ACT inhaler Inhale 2 puffs into the lungs every 6 (six) hours as needed for wheezing or shortness of breath.    [provider]  ?aspirin 81 MG chewable tablet Chew 1 tablet (81 mg total) by mouth 2 (two) times daily. 12/31/17   Cammy Copa, MD  ?calcium carbonate (TUMS - DOSED IN MG ELEMENTAL CALCIUM) 500 MG chewable tablet Chew 1 tablet by mouth 2 (two) times daily as needed for indigestion or  heartburn.    [provider]  ?docusate sodium (COLACE) 100 MG capsule Take 1 capsule (100 mg total) by mouth 2 (two) times daily. 12/31/17   Cammy Copa, MD  ?gabapentin (NEURONTIN) 300 MG capsule Take 1 capsule (300 mg total) by mouth 3 (three) times daily. 12/31/17   Cammy Copa, MD  ?ibuprofen (ADVIL,MOTRIN) 200 MG tablet Take 400 mg by mouth every 6 (six) hours as needed for headache or moderate pain.     [provider]  ?methocarbamol (ROBAXIN) 500 MG tablet Take 1 tablet (500 mg total) by mouth every 6 (six) hours as needed for muscle spasms. 12/31/17   Cammy Copa, MD  ?Multiple Vitamin (MULTIVITAMIN WITH MINERALS) TABS tablet Take 1 tablet by mouth at bedtime.     [provider]  ?oxyCODONE (OXY IR/ROXICODONE) 5 MG immediate release tablet 1 po q 8-12 hrs prn pain ?Patient not taking: Reported on 02/08/2018 01/15/18   Cammy Copa, MD  ?oxycodone (OXY-IR) 5 MG capsule 1-2 po q 4-6hrs prn pain ?Patient not taking: Reported on 02/08/2018 01/07/18   Cammy Copa, MD  ?pantoprazole (PROTONIX) 40 MG tablet Take 40 mg by mouth daily. 02/14/15   [provider]  ?Probiotic Product (ALIGN) 4 MG CAPS Take by mouth.    [provider]  ?trolamine salicylate (ASPERCREME) 10 % cream Apply 1 application topically as needed for muscle pain.    [provider]  ?   ? ?Allergies    ?Sulfa antibiotics   ? ?Review of Systems   ?Review of Systems  ?All other systems reviewed and are negative. ? ?Physical Exam ?Updated Vital Signs ?BP (!) 165/83 (BP Location: Right Arm)   Pulse 77   Temp 98.5 ?F (36.9 ?C)   Resp 16   SpO2 100%  ?Physical Exam ?Vitals and nursing note reviewed.  ?Constitutional:   ?   Appearance: She is well-developed.  ?HENT:  ?   Head: Normocephalic.  ?   Right Ear: Tympanic membrane normal.  ?   Left Ear: Tympanic membrane normal.  ?   Nose: Nose normal.  ?   Mouth/Throat:  ?   Mouth: Mucous membranes are dry.   ?Eyes:  ?   Conjunctiva/sclera: Conjunctivae normal.  ?   Pupils: Pupils are equal, round, and reactive to light.  ?Cardiovascular:  ?   Rate and Rhythm: Normal rate and regular rhythm.  ?Pulmonary:  ?   Effort: Pulmonary effort is normal.  ?Abdominal:  ?   General: There is no distension.  ?Musculoskeletal:     ?   General: Normal range of motion.  ?   Cervical back: Normal range of motion and neck supple.  ?   Comments: No cervical thoracic or lumbar spine tenderness.  ?Skin: ?   General: Skin is warm.  ?   Comments: 1 cm superficial abrasion scalp, no gaping no current active bleeding  ?Neurological:  ?   General: No focal deficit present.  ?   Mental Status: She is alert and oriented to person, place, and time.  ? ? ?ED Results / Procedures / Treatments   ?Labs ?(all labs ordered are listed, but only abnormal results are displayed) ?Labs Reviewed - No data to display ? ?EKG ?None ? ?Radiology ?No results found. ? ?Procedures ?Procedures  ? ? ?Medications Ordered in ED ?Medications - No data to display ? ?ED Course/ Medical Decision Making/ A&P ?  ?                        ?Medical Decision Making ?Patient reports she was hit in the head with a golf ball patient has been dizzy since the impact.  Patient reports that she spoke with her daughter who is a nurse who advised her she should come to the emergency department for a head scan. ? ?Problems Addressed: ?Laceration of scalp, initial encounter: acute illness or injury ?   Details: Patient reports she had a large amount of bleeding from the laceration from her scalp ? ?Amount and/or Complexity of Data Reviewed ?Independent Historian: spouse ?   Details: Patient's husband is present he was with her when she was hit.  He reports she did not lose consciousness ?External Data Reviewed: notes. ?   Details: Primary care notes reviewed.  Transplant notes reviewed ?Radiology: ordered and independent interpretation performed. Decision-making details documented in ED  Course. ?   Details: CT scan of patient's head is ordered reviewed and interpreted CT scan is normal. ? ?Risk ?OTC drugs. ?Risk Details: Patient and husband counseled on concussion symptoms.  The laceration to patient's scalp does not require sutures.  Bleeding is completely controlled at this time.  Patient is advised to hold pressure if any further bleeding.  Patient is stable for discharge at this time ? ? ? ? ? ? ? ? ? ? ?Final Clinical Impression(s) / ED Diagnoses ?Final diagnoses:  ?Laceration of scalp,  initial encounter  ? ? ?Rx / DC Orders ?ED Discharge Orders   ? ? None  ? ?  ? ?An After Visit Summary was printed and given to the patient.  ?  ?Elson AreasSofia, Krysten Veronica K, PA-C ?05/23/21 1350 ? ?  ?Franne FortsGray, Alicia P, DO ?05/24/21 [phone] ? ?

## 2022-04-23 ENCOUNTER — Telehealth: Payer: Self-pay | Admitting: Orthopedic Surgery

## 2022-04-23 MED ORDER — AMOXICILLIN 500 MG PO TABS: ORAL_TABLET | ORAL | 0 refills | Status: AC

## 2022-04-23 NOTE — Addendum Note (Signed)
Addended byLaurann Montana on: 04/23/2022 01:10 PM   Modules accepted: Orders

## 2022-04-23 NOTE — Telephone Encounter (Signed)
IC to advise submitted, no answer.

## 2022-04-23 NOTE — Telephone Encounter (Signed)
Patient needs antibiotics she is currently getting her teeth cleaned

## 2023-04-14 ENCOUNTER — Other Ambulatory Visit (INDEPENDENT_AMBULATORY_CARE_PROVIDER_SITE_OTHER): Payer: Self-pay

## 2023-04-14 ENCOUNTER — Encounter (HOSPITAL_BASED_OUTPATIENT_CLINIC_OR_DEPARTMENT_OTHER): Payer: Self-pay

## 2023-04-14 ENCOUNTER — Emergency Department (HOSPITAL_BASED_OUTPATIENT_CLINIC_OR_DEPARTMENT_OTHER)
Admission: EM | Admit: 2023-04-14 | Discharge: 2023-04-14 | Disposition: A | Discharge status: Home / Self Care | Payer: Medicare HMO | Attending: Emergency Medicine | Admitting: Emergency Medicine

## 2023-04-14 ENCOUNTER — Emergency Department (HOSPITAL_BASED_OUTPATIENT_CLINIC_OR_DEPARTMENT_OTHER): Payer: Medicare HMO | Admitting: Radiology

## 2023-04-14 ENCOUNTER — Ambulatory Visit: Payer: Medicare HMO | Admitting: Orthopaedic Surgery

## 2023-04-14 ENCOUNTER — Other Ambulatory Visit: Payer: Self-pay

## 2023-04-14 DIAGNOSIS — J45909 Unspecified asthma, uncomplicated: Secondary | ICD-10-CM | POA: Diagnosis not present

## 2023-04-14 DIAGNOSIS — M25532 Pain in left wrist: Secondary | ICD-10-CM

## 2023-04-14 DIAGNOSIS — I10 Essential (primary) hypertension: Secondary | ICD-10-CM | POA: Diagnosis not present

## 2023-04-14 DIAGNOSIS — S6992XA Unspecified injury of left wrist, hand and finger(s), initial encounter: Secondary | ICD-10-CM | POA: Diagnosis present

## 2023-04-14 DIAGNOSIS — Z7982 Long term (current) use of aspirin: Secondary | ICD-10-CM | POA: Insufficient documentation

## 2023-04-14 DIAGNOSIS — W010XXA Fall on same level from slipping, tripping and stumbling without subsequent striking against object, initial encounter: Secondary | ICD-10-CM | POA: Diagnosis not present

## 2023-04-14 DIAGNOSIS — Y9301 Activity, walking, marching and hiking: Secondary | ICD-10-CM | POA: Diagnosis not present

## 2023-04-14 DIAGNOSIS — S52572A Other intraarticular fracture of lower end of left radius, initial encounter for closed fracture: Secondary | ICD-10-CM | POA: Diagnosis not present

## 2023-04-14 MED ORDER — OXYCODONE-ACETAMINOPHEN 5-325 MG PO TABS: 1.0000 | ORAL_TABLET | Freq: Four times a day (QID) | ORAL | 0 refills | Status: AC | PRN

## 2023-04-14 NOTE — ED Provider Notes (Signed)
[place] EMERGENCY DEPARTMENT AT [facility] Provider Note   CSN: [id number] Arrival date & time: 04/14/23  0453     History  Chief Complaint  Patient presents with   Wrist Pain    Cynthia Pace is a 63 y.o. female.  HPI     This is a 63 year old female who presents with left wrist pain.  Patient reports that she tripped and fell catching herself on her left wrist.  She did have some alcohol tonight.  She is right-handed.  Denies hitting her head or loss of consciousness.  Denies other injury.  Reports extensive orthopedic history and follows closely at [facility], Dr. August Saucer.  Home Medications Prior to Admission medications   Medication Sig Start Date End Date Taking? Authorizing Provider  oxyCODONE-acetaminophen (PERCOCET/ROXICET) 5-325 MG tablet Take 1 tablet by mouth every 6 (six) hours as needed for severe pain (pain score 7-10). 04/14/23  Yes Razia Screws, Mayer Masker, MD  acetaminophen (TYLENOL) 500 MG tablet 1 po bid prn 12/03/17   Cammy Copa, MD  ADVAIR DISKUS 250-50 MCG/DOSE AEPB Inhale 1 puff into the lungs daily.     [provider]  albuterol (PROVENTIL HFA;VENTOLIN HFA) 108 (90 Base) MCG/ACT inhaler Inhale 2 puffs into the lungs every 6 (six) hours as needed for wheezing or shortness of breath.    [provider]  amoxicillin (AMOXIL) 500 MG tablet Take 2 grams 1 hour prior to dental procedure 04/23/22   Cammy Copa, MD  aspirin 81 MG chewable tablet Chew 1 tablet (81 mg total) by mouth 2 (two) times daily. 12/31/17   Cammy Copa, MD  calcium carbonate (TUMS - DOSED IN MG ELEMENTAL CALCIUM) 500 MG chewable tablet Chew 1 tablet by mouth 2 (two) times daily as needed for indigestion or heartburn.    [provider]  docusate sodium (COLACE) 100 MG capsule Take 1 capsule (100 mg total) by mouth 2 (two) times daily. 12/31/17   Cammy Copa, MD  gabapentin (NEURONTIN) 300 MG capsule Take 1 capsule (300 mg total) by  mouth 3 (three) times daily. 12/31/17   Cammy Copa, MD  ibuprofen (ADVIL,MOTRIN) 200 MG tablet Take 400 mg by mouth every 6 (six) hours as needed for headache or moderate pain.     [provider]  methocarbamol (ROBAXIN) 500 MG tablet Take 1 tablet (500 mg total) by mouth every 6 (six) hours as needed for muscle spasms. 12/31/17   Cammy Copa, MD  Multiple Vitamin (MULTIVITAMIN WITH MINERALS) TABS tablet Take 1 tablet by mouth at bedtime.     [provider]  oxyCODONE (OXY IR/ROXICODONE) 5 MG immediate release tablet 1 po q 8-12 hrs prn pain Patient not taking: Reported on 02/08/2018 01/15/18   Cammy Copa, MD  oxycodone (OXY-IR) 5 MG capsule 1-2 po q 4-6hrs prn pain Patient not taking: Reported on 02/08/2018 01/07/18   Cammy Copa, MD  pantoprazole (PROTONIX) 40 MG tablet Take 40 mg by mouth daily. 02/14/15   [provider]  Probiotic Product (ALIGN) 4 MG CAPS Take by mouth.    [provider]  trolamine salicylate (ASPERCREME) 10 % cream Apply 1 application topically as needed for muscle pain.    [provider]      Allergies    Sulfa antibiotics    Review of Systems   Review of Systems  Constitutional:  Negative for fever.  Musculoskeletal:        Wrist pain  All other  systems reviewed and are negative.   Physical Exam Updated Vital Signs BP (!) 146/80   Pulse 71   Temp (!) 97.3 F (36.3 C) (Oral)   Resp 15   SpO2 100%  Physical Exam Vitals and nursing note reviewed.  Constitutional:      Appearance: She is well-developed. She is not ill-appearing.  HENT:     Head: Normocephalic and atraumatic.  Eyes:     Pupils: Pupils are equal, round, and reactive to light.  Cardiovascular:     Rate and Rhythm: Normal rate and regular rhythm.  Pulmonary:     Effort: Pulmonary effort is normal. No respiratory distress.  Abdominal:     Palpations: Abdomen is soft.  Musculoskeletal:     Cervical back:  Neck supple.     Comments: Tenderness to palpation left wrist, obvious deformity noted, 2+ radial pulse, neurovascularly intact distally  Skin:    General: Skin is warm and dry.  Neurological:     Mental Status: She is alert and oriented to person, place, and time.  Psychiatric:        Mood and Affect: Mood normal.     ED Results / Procedures / Treatments   Labs (all labs ordered are listed, but only abnormal results are displayed) Labs Reviewed - No data to display  EKG None  Radiology DG Wrist Complete Left Result Date: 04/14/2023 CLINICAL DATA:  63 year old female status post fall. EXAM: LEFT WRIST - COMPLETE 3+ VIEW COMPARISON:  Report of left hand series 09/09/2012 (no images available). FINDINGS: Four views at 0517 hours demonstrate fractures of the distal left radius and ulna. Minimally displaced ulnar styloid fracture. Transverse mildly comminuted distal left radius fracture with evidence of DRU and radiocarpal joint involvement. Lateral view is oblique. There is mild dorsal impaction. Carpal bone alignment maintained. Healed remote 4th metacarpal fracture, which was reported in 2014. No other acute fracture identified. IMPRESSION: 1. Acute distal left radius fracture with mild dorsal impaction, radiocarpal and DRU joint involvement. 2. Mildly displaced acute ulnar styloid fracture. 3. 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 Electronically Signed   By: Odessa Fleming M.D.   On: 04/14/2023 05:46    Procedures Procedures    Medications Ordered in ED Medications - No data to display  ED Course/ Medical Decision Making/ A&P                                 Medical Decision Making Amount and/or Complexity of Data Reviewed Radiology: ordered.  Risk Prescription drug management.   This patient presents to the ED for concern of left wrist pain, this involves an extensive number of treatment options, and is a complaint that carries with it a high risk of complications and morbidity.  I  considered the following differential and admission for this acute, potentially life threatening condition.  The differential diagnosis includes fracture, sprain  MDM:    This is a 63 year old female who presents with left wrist pain.  She has an obvious deformity.  Suspect fracture.  No other obvious injuries.  X-rays reviewed and show distal radius fracture with an ulnar styloid fracture.  Patient was placed in a sugar-tong splint and given a sling.  Prefers to follow-up with [facility].  She was provided follow-up information.  (Labs, imaging, consults)  Labs: I Ordered, and personally interpreted labs.  The pertinent results include: None  Imaging Studies ordered: I ordered imaging studies including x-ray wrist I independently  visualized and interpreted imaging. I agree with the radiologist interpretation  Additional history obtained from chart review.  External records from outside source obtained and reviewed including prior evaluations  Cardiac Monitoring: The patient was not maintained on a cardiac monitor.  If on the cardiac monitor, I personally viewed and interpreted the cardiac monitored which showed an underlying rhythm of: N/A  Reevaluation: After the interventions noted above, I reevaluated the patient and found that they have :stayed the same  Social Determinants of Health:  lives independently  Disposition: Discharged with Ortho follow-up  Co morbidities that complicate the patient evaluation  Past Medical History:  Diagnosis Date   Arthritis    "left ankle" (05/04/2015)   Asthma    Carpal tunnel syndrome    Chronic bronchitis (HCC)    Chronic lower back pain    Elevated liver enzymes    Environmental allergies    Family history of adverse reaction to anesthesia    "daughter died after 2nd epidural for C-section; they were able to bring her back"   GERD (gastroesophageal reflux disease)    History of kidney stones    History of TMJ syndrome     Hyperlipidemia    Hypertension    Kidney stones    Restless leg syndrome    Seasonal allergies      Medicines Meds ordered this encounter  Medications   oxyCODONE-acetaminophen (PERCOCET/ROXICET) 5-325 MG tablet    Sig: Take 1 tablet by mouth every 6 (six) hours as needed for severe pain (pain score 7-10).    Dispense:  10 tablet    Refill:  0    I have reviewed the patients home medicines and have made adjustments as needed  Problem List / ED Course: Problem List Items Addressed This Visit   None Visit Diagnoses       Other closed intra-articular fracture of distal end of left radius, initial encounter    -  Primary                   Final Clinical Impression(s) / ED Diagnoses Final diagnoses:  Other closed intra-articular fracture of distal end of left radius, initial encounter    Rx / DC Orders ED Discharge Orders          Ordered    oxyCODONE-acetaminophen (PERCOCET/ROXICET) 5-325 MG tablet  Every 6 hours PRN        04/14/23 0558              Shon Baton, MD 04/14/23 [phone]

## 2023-04-14 NOTE — ED Notes (Signed)
Left arm elevated for pt's comfort

## 2023-04-14 NOTE — Discharge Instructions (Addendum)
You were seen today and found to have a fracture of the left wrist.  Keep iced and elevated.  Take pain medications as prescribed.  Do not combine pain medications with alcohol or drive while taking pain medications.  Follow[facility]up at [facility].

## 2023-04-14 NOTE — Progress Notes (Signed)
Office Visit Note   Patient: Cynthia Pace           Date of Birth: 1960/04/20           MRN: [id number] Visit Date: 04/14/2023              Requested by: Gwenlyn Found, MD No address on file PCP: Gwenlyn Found, MD   Assessment & Plan: Visit Diagnoses:  1. Pain in left wrist     Plan: Cynthia Pace is a 63 year old female with a left distal radius fracture.  Alignment is acceptable for nonoperative treatment.  We will keep her in the sugar-tong splint at I stressed the importance of elevation above the level of the heart.  She has oxycodone from the ER.  She will follow-up in a week for repeat two-view x-rays of the left wrist.  Follow-Up Instructions: Return in about 1 week (around 04/21/2023).   Orders:  Orders Placed This Encounter  Procedures   XR Wrist 2 Views Left   No orders of the defined types were placed in this encounter.     Procedures: No procedures performed   Clinical Data: No additional findings.   Subjective: Chief Complaint  Patient presents with   Left Wrist - Injury    DOI 04/13/2023    HPI Cynthia Pace is an ER follow-up from earlier this morning.  She fell last night onto outstretched hand.  Went to [facility] this morning.  She is in a sugar-tong splint.  She is a prior patient of Dr. Diamantina Providence.  Reports pain and swelling to the fingers and the wrist. Review of Systems  Constitutional: Negative.   HENT: Negative.    Eyes: Negative.   Respiratory: Negative.    Cardiovascular: Negative.   Endocrine: Negative.   Musculoskeletal: Negative.   Neurological: Negative.   Hematological: Negative.   Psychiatric/Behavioral: Negative.    All other systems reviewed and are negative.    Objective: Vital Signs: There were no vitals taken for this visit.  Physical Exam Vitals and nursing note reviewed.  Constitutional:      Appearance: She is well-developed.  HENT:     Head: Atraumatic.     Nose: Nose normal.  Eyes:     Extraocular  Movements: Extraocular movements intact.  Cardiovascular:     Pulses: Normal pulses.  Pulmonary:     Effort: Pulmonary effort is normal.  Abdominal:     Palpations: Abdomen is soft.  Musculoskeletal:     Cervical back: Neck supple.  Skin:    General: Skin is warm.     Capillary Refill: Capillary refill takes less than 2 seconds.  Neurological:     Mental Status: She is alert. Mental status is at baseline.  Psychiatric:        Behavior: Behavior normal.        Thought Content: Thought content normal.        Judgment: Judgment normal.     Ortho Exam Examination of the left wrist shows a well-fitting sugar-tong splint.  Fingers are mildly swollen.  No neurovascular compromise. Specialty Comments:  No specialty comments available.  Imaging: XR Wrist 2 Views Left Result Date: 04/14/2023 X-rays of the left wrist shows a distal radius fracture with minimal dorsal angulation.  No radial height loss or loss and radial inclination.  Minimally displaced ulnar styloid fracture.  DG Wrist Complete Left Result Date: 04/14/2023 CLINICAL DATA:  63 year old female status post fall. EXAM: LEFT WRIST - COMPLETE 3+ VIEW COMPARISON:  Report of left hand series 09/09/2012 (no images available). FINDINGS: Four views at 0517 hours demonstrate fractures of the distal left radius and ulna. Minimally displaced ulnar styloid fracture. Transverse mildly comminuted distal left radius fracture with evidence of DRU and radiocarpal joint involvement. Lateral view is oblique. There is mild dorsal impaction. Carpal bone alignment maintained. Healed remote 4th metacarpal fracture, which was reported in 2014. No other acute fracture identified. IMPRESSION: 1. Acute distal left radius fracture with mild dorsal impaction, radiocarpal and DRU joint involvement. 2. Mildly displaced acute ulnar styloid fracture. 3. 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 Electronically Signed   By: Odessa Fleming M.D.   On: 04/14/2023 05:46     PMFS  History: Patient Active Problem List   Diagnosis Date Noted   Avascular necrosis of bone of left hip (HCC)    Hip arthritis 12/29/2017   Lumbar pain 06/08/2017   Shoulder fracture, left 05/03/2015   Past Medical History:  Diagnosis Date   Arthritis    "left ankle" (05/04/2015)   Asthma    Carpal tunnel syndrome    Chronic bronchitis (HCC)    Chronic lower back pain    Elevated liver enzymes    Environmental allergies    Family history of adverse reaction to anesthesia    "daughter died after 2nd epidural for C-section; they were able to bring her back"   GERD (gastroesophageal reflux disease)    History of kidney stones    History of TMJ syndrome    Hyperlipidemia    Hypertension    Kidney stones    Restless leg syndrome    Seasonal allergies     Family History  Problem Relation Age of Onset   Tuberculosis Mother    Heart disease Mother    Cancer Father     Past Surgical History:  Procedure Laterality Date   ABDOMINAL HYSTERECTOMY     ANKLE SURGERY Left    severed artery and tendons   APPENDECTOMY     CYSTOSCOPY W/ STONE MANIPULATION  X 1   FRACTURE SURGERY     JOINT REPLACEMENT     ORIF HUMERUS FRACTURE Left 05/03/2015   Procedure: OPEN REDUCTION INTERNAL FIXATION (ORIF) LEFT PROXIMAL HUMERUS FRACTURE NONUNION;  Surgeon: Cammy Copa, MD;  Location: [facility] OR;  Service: Orthopedics;  Laterality: Left;   TONSILLECTOMY     TOTAL HIP ARTHROPLASTY Left 12/29/2017   TOTAL HIP ARTHROPLASTY Left 12/29/2017   Procedure: LEFT TOTAL HIP ARTHROPLASTY ANTERIOR APPROACH;  Surgeon: Cammy Copa, MD;  Location: [facility] OR;  Service: Orthopedics;  Laterality: Left;   Social History   Occupational History   Not on file  Tobacco Use   Smoking status: Every Day    Current packs/day: 1.00    Average packs/day: 1 pack/day for 18.0 years (18.0 ttl pk-yrs)    Types: Cigarettes   Smokeless tobacco: Never  Vaping Use   Vaping status: Former   Devices: patient states only used  one time  Substance and Sexual Activity   Alcohol use: Yes    Alcohol/week: 1.0 - 2.0 standard drink of alcohol    Types: 1 - 2 Glasses of wine per week   Drug use: No   Sexual activity: Not Currently

## 2023-04-14 NOTE — ED Notes (Signed)
1610 Lt upper extremity elevated and ice applied

## 2023-04-14 NOTE — ED Triage Notes (Signed)
Pt states that she was walking and lost her balance causing her to fall and she caught herself with her left arm. Pt has pain and swelling in left wrist. Pt admits to drinking several glasses of wine prior to fall. Denies LOC. Denies hitting head.

## 2023-04-14 NOTE — ED Notes (Signed)
Dr. Horton at bedside. 

## 2023-04-22 ENCOUNTER — Encounter: Payer: Self-pay | Admitting: Orthopaedic Surgery

## 2023-04-22 ENCOUNTER — Other Ambulatory Visit (INDEPENDENT_AMBULATORY_CARE_PROVIDER_SITE_OTHER): Payer: Self-pay

## 2023-04-22 ENCOUNTER — Ambulatory Visit: Payer: Medicare HMO | Admitting: Orthopaedic Surgery

## 2023-04-22 DIAGNOSIS — S52572A Other intraarticular fracture of lower end of left radius, initial encounter for closed fracture: Secondary | ICD-10-CM | POA: Diagnosis not present

## 2023-04-22 DIAGNOSIS — S52502A Unspecified fracture of the lower end of left radius, initial encounter for closed fracture: Secondary | ICD-10-CM | POA: Insufficient documentation

## 2023-04-22 NOTE — Progress Notes (Signed)
 Office Visit Note    Patient: Cynthia Pace           Date of Birth: 1960/09/14           MRN: [id number] Visit Date: 04/22/2023              Requested by: Gwenlyn Found, MD No address on file PCP: Gwenlyn Found, MD   Assessment & Plan: Visit Diagnoses:  1. Other closed intra-articular fracture of distal end of left radius, initial encounter     Plan: Fracture is stable x-rays.  We will keep her in the sugar-tong splint to limit rotation of the wrist.  Recheck in 2 weeks with repeat radiographs out of the splint.  Anticipate transitioning to a short arm cast at that time.  Follow-Up Instructions: Return in about 2 weeks (around 05/06/2023).   Orders:  Orders Placed This Encounter  Procedures   XR Wrist Complete Left   No orders of the defined types were placed in this encounter.     Procedures: No procedures performed   Clinical Data: No additional findings.   Subjective: Chief Complaint  Patient presents with   Left Wrist - Follow-up    Left distal radius fracture    HPI Miosotis comes in today for fracture follow-up.  Overall feels improvement. Review of Systems   Objective: Vital Signs: There were no vitals taken for this visit.  Physical Exam  Ortho Exam Examination shows a well-fitting sugar-tong splint.  Swelling has improved in the fingers. Specialty Comments:  No specialty comments available.  Imaging: XR Wrist Complete Left Result Date: 04/22/2023 X-rays of the left wrist shows a stable alignment of the distal radius and ulnar styloid fracture without any interval worsening.    PMFS History: Patient Active Problem List   Diagnosis Date Noted   Closed fracture of left distal radius 04/22/2023   Avascular necrosis of bone of left hip (HCC)    Hip arthritis 12/29/2017   Lumbar pain 06/08/2017   Shoulder fracture, left 05/03/2015   Past Medical History:  Diagnosis Date   Arthritis    "left ankle" (05/04/2015)   Asthma     Carpal tunnel syndrome    Chronic bronchitis (HCC)    Chronic lower back pain    Elevated liver enzymes    Environmental allergies    Family history of adverse reaction to anesthesia    "daughter died after 2nd epidural for C-section; they were able to bring her back"   GERD (gastroesophageal reflux disease)    History of kidney stones    History of TMJ syndrome    Hyperlipidemia    Hypertension    Kidney stones    Restless leg syndrome    Seasonal allergies     Family History  Problem Relation Age of Onset   Tuberculosis Mother    Heart disease Mother    Cancer Father     Past Surgical History:  Procedure Laterality Date   ABDOMINAL HYSTERECTOMY     ANKLE SURGERY Left    severed artery and tendons   APPENDECTOMY     CYSTOSCOPY W/ STONE MANIPULATION  X 1   FRACTURE SURGERY     JOINT REPLACEMENT     ORIF HUMERUS FRACTURE Left 05/03/2015   Procedure: OPEN REDUCTION INTERNAL FIXATION (ORIF) LEFT PROXIMAL HUMERUS FRACTURE NONUNION;  Surgeon: Cammy Copa, MD;  Location: [facility] OR;  Service: Orthopedics;  Laterality: Left;   TONSILLECTOMY     TOTAL HIP ARTHROPLASTY Left  12/29/2017   TOTAL HIP ARTHROPLASTY Left 12/29/2017   Procedure: LEFT TOTAL HIP ARTHROPLASTY ANTERIOR APPROACH;  Surgeon: Cammy Copa, MD;  Location: [facility] OR;  Service: Orthopedics;  Laterality: Left;   Social History   Occupational History   Not on file  Tobacco Use   Smoking status: Every Day    Current packs/day: 1.00    Average packs/day: 1 pack/day for 18.0 years (18.0 ttl pk-yrs)    Types: Cigarettes   Smokeless tobacco: Never  Vaping Use   Vaping status: Former   Devices: patient states only used one time  Substance and Sexual Activity   Alcohol use: Yes    Alcohol/week: 1.0 - 2.0 standard drink of alcohol    Types: 1 - 2 Glasses of wine per week   Drug use: No   Sexual activity: Not Currently

## 2023-04-23 ENCOUNTER — Telehealth: Payer: Self-pay | Admitting: Orthopaedic Surgery

## 2023-04-23 NOTE — Telephone Encounter (Signed)
 Patient states she was placed on a new medication Propranolol by her pcp and has question regarding the side effects of this medication. Pt states she has not spoken to her pcp about the medication she wanted to ask Dr Roda Shutters instead.

## 2023-04-23 NOTE — Telephone Encounter (Signed)
 She needs to ask her PCP about propranolol.  That is not something I prescribe

## 2023-04-23 NOTE — Telephone Encounter (Signed)
 Spoke with patient and advised that she contact her PCP.

## 2023-05-06 ENCOUNTER — Ambulatory Visit: Payer: Medicare HMO | Admitting: Orthopaedic Surgery

## 2023-05-06 ENCOUNTER — Other Ambulatory Visit (INDEPENDENT_AMBULATORY_CARE_PROVIDER_SITE_OTHER): Payer: Self-pay

## 2023-05-06 DIAGNOSIS — S52572A Other intraarticular fracture of lower end of left radius, initial encounter for closed fracture: Secondary | ICD-10-CM

## 2023-05-06 DIAGNOSIS — M25532 Pain in left wrist: Secondary | ICD-10-CM | POA: Diagnosis not present

## 2023-05-06 NOTE — Progress Notes (Signed)
 Office Visit Note   Patient: Cynthia Pace           Date of Birth: [birth year]           MRN: [id number] Visit Date: 05/06/2023              Requested by: Gwenlyn Found, MD [address] [place],  [place] [address] PCP: Gwenlyn Found, MD   Assessment & Plan: Visit Diagnoses:  1. Other closed intra-articular fracture of distal end of left radius, initial encounter   2. Pain in left wrist     Plan: Patient is 3 weeks status post left distal radius fracture.  This is still amenable to nonoperative treatment.  We will place her in a short arm cast today.  Activity restrictions and modifications reviewed.  Recheck in 3 weeks with repeat radiographs of the left wrist out of the cast.  Follow-Up Instructions: Return in about 3 weeks (around 05/27/2023).   Orders:  Orders Placed This Encounter  Procedures   XR Wrist Complete Left   DG BONE DENSITY (DXA)   Vitamin D (25 hydroxy)   Calcium   No orders of the defined types were placed in this encounter.     Procedures: No procedures performed   Clinical Data: No additional findings.   Subjective: Chief Complaint  Patient presents with   Left Wrist - Pain, Follow-up    HPI Cynthia Pace returns today for fracture follow-up care. Review of Systems   Objective: Vital Signs: There were no vitals taken for this visit.  Physical Exam  Ortho Exam Examination of the left wrist is unremarkable. Specialty Comments:  No specialty comments available.  Imaging: XR Wrist Complete Left Result Date: 05/06/2023 X-rays of the left wrist show stable alignment of a distal radius and ulnar styloid fracture with evidence of early fracture consolidation.    PMFS History: Patient Active Problem List   Diagnosis Date Noted   Closed fracture of left distal radius 04/22/2023   Avascular necrosis of bone of left hip (HCC)    Hip arthritis 12/29/2017   Lumbar pain 06/08/2017   Shoulder fracture, left 05/03/2015   Past Medical  History:  Diagnosis Date   Arthritis    "left ankle" (05/04/2015)   Asthma    Carpal tunnel syndrome    Chronic bronchitis (HCC)    Chronic lower back pain    Elevated liver enzymes    Environmental allergies    Family history of adverse reaction to anesthesia    "daughter died after 2nd epidural for C-section; they were able to bring her back"   GERD (gastroesophageal reflux disease)    History of kidney stones    History of TMJ syndrome    Hyperlipidemia    Hypertension    Kidney stones    Restless leg syndrome    Seasonal allergies     Family History  Problem Relation Age of Onset   Tuberculosis Mother    Heart disease Mother    Cancer Father     Past Surgical History:  Procedure Laterality Date   ABDOMINAL HYSTERECTOMY     ANKLE SURGERY Left    severed artery and tendons   APPENDECTOMY     CYSTOSCOPY W/ STONE MANIPULATION  X 1   FRACTURE SURGERY     JOINT REPLACEMENT     ORIF HUMERUS FRACTURE Left 05/03/2015   Procedure: OPEN REDUCTION INTERNAL FIXATION (ORIF) LEFT PROXIMAL HUMERUS FRACTURE NONUNION;  Surgeon: Cammy Copa, MD;  Location: [facility] OR;  Service: Orthopedics;  Laterality: Left;   TONSILLECTOMY     TOTAL HIP ARTHROPLASTY Left 12/29/2017   TOTAL HIP ARTHROPLASTY Left 12/29/2017   Procedure: LEFT TOTAL HIP ARTHROPLASTY ANTERIOR APPROACH;  Surgeon: Cammy Copa, MD;  Location: [facility] OR;  Service: Orthopedics;  Laterality: Left;   Social History   Occupational History   Not on file  Tobacco Use   Smoking status: Every Day    Current packs/day: 1.00    Average packs/day: 1 pack/day for 18.0 years (18.0 ttl pk-yrs)    Types: Cigarettes   Smokeless tobacco: Never  Vaping Use   Vaping status: Former   Devices: patient states only used one time  Substance and Sexual Activity   Alcohol use: Yes    Alcohol/week: 1.0 - 2.0 standard drink of alcohol    Types: 1 - 2 Glasses of wine per week   Drug use: No   Sexual activity: Not Currently

## 2023-05-07 ENCOUNTER — Encounter: Payer: Self-pay | Admitting: Orthopaedic Surgery

## 2023-05-07 LAB — VITAMIN D 25 HYDROXY (VIT D DEFICIENCY, FRACTURES): Vit D, 25-Hydroxy: 59 ng/mL (ref 30–100)

## 2023-05-07 LAB — EXTRA LAV TOP TUBE

## 2023-05-07 LAB — CALCIUM: Calcium: 9.9 mg/dL (ref 8.6–10.4)

## 2023-05-19 ENCOUNTER — Ambulatory Visit (HOSPITAL_BASED_OUTPATIENT_CLINIC_OR_DEPARTMENT_OTHER)
Admission: RE | Admit: 2023-05-19 | Discharge: 2023-05-19 | Disposition: A | Discharge status: Home / Self Care | Source: Ambulatory Visit | Attending: Orthopaedic Surgery | Admitting: Orthopaedic Surgery

## 2023-05-19 DIAGNOSIS — Z96642 Presence of left artificial hip joint: Secondary | ICD-10-CM | POA: Diagnosis not present

## 2023-05-19 DIAGNOSIS — M25532 Pain in left wrist: Secondary | ICD-10-CM | POA: Insufficient documentation

## 2023-05-19 DIAGNOSIS — S52572A Other intraarticular fracture of lower end of left radius, initial encounter for closed fracture: Secondary | ICD-10-CM | POA: Insufficient documentation

## 2023-05-19 DIAGNOSIS — F17200 Nicotine dependence, unspecified, uncomplicated: Secondary | ICD-10-CM | POA: Insufficient documentation

## 2023-05-19 DIAGNOSIS — M85851 Other specified disorders of bone density and structure, right thigh: Secondary | ICD-10-CM | POA: Diagnosis not present

## 2023-05-19 DIAGNOSIS — X58XXXA Exposure to other specified factors, initial encounter: Secondary | ICD-10-CM | POA: Diagnosis not present

## 2023-05-28 ENCOUNTER — Ambulatory Visit (INDEPENDENT_AMBULATORY_CARE_PROVIDER_SITE_OTHER): Admitting: Orthopaedic Surgery

## 2023-05-28 ENCOUNTER — Other Ambulatory Visit (INDEPENDENT_AMBULATORY_CARE_PROVIDER_SITE_OTHER): Payer: Self-pay

## 2023-05-28 DIAGNOSIS — S52572A Other intraarticular fracture of lower end of left radius, initial encounter for closed fracture: Secondary | ICD-10-CM

## 2023-05-28 DIAGNOSIS — M25532 Pain in left wrist: Secondary | ICD-10-CM

## 2023-05-28 NOTE — Progress Notes (Signed)
 Office Visit Note   Patient: Cynthia Pace           Date of Birth: Jul 25, 1960           MRN: [id number] Visit Date: 05/28/2023              Requested by: Gwenlyn Found, MD [address] [place],  [place] [address] PCP: Gwenlyn Found, MD   Assessment & Plan: Visit Diagnoses:  1. Other closed intra-articular fracture of distal end of left radius, initial encounter   2. Pain in left wrist     Plan: Cynthia Pace is 6 weeks status post nonoperative treatment of a distal radius fracture.  Doing well and healing appropriately on x-rays.  Will send her to OT for rehab.  Will make a referral to [facility] for osteoporosis management for recent findings on bone density scan.  Follow-up in 6 weeks for recheck of the wrist with x-rays.  Follow-Up Instructions: Return in about 6 weeks (around 07/09/2023).   Orders:  Orders Placed This Encounter  Procedures   XR Wrist Complete Left   Amb Referral to Osteoporosis Management    Ambulatory referral to Occupational Therapy   No orders of the defined types were placed in this encounter.     Procedures: No procedures performed   Clinical Data: No additional findings.   Subjective: Chief Complaint  Patient presents with   Left Wrist - Pain    HPI Cynthia Pace returns today for 6-week follow-up check for left distal radius fracture.  She is feeling better overall. Review of Systems   Objective: Vital Signs: There were no vitals taken for this visit.  Physical Exam  Ortho Exam Exam of the left wrist shows expected stiffness from casting.  Slight swelling.  No neurovascular compromise. Specialty Comments:  No specialty comments available.  Imaging: XR Wrist Complete Left Result Date: 05/28/2023 X-rays of the left wrist show unchanged alignment of the distal radius and ulnar styloid fracture.  There is evidence of progressive fracture healing.    PMFS History: Patient Active Problem List   Diagnosis Date Noted   Closed  fracture of left distal radius 04/22/2023   Avascular necrosis of bone of left hip (HCC)    Hip arthritis 12/29/2017   Lumbar pain 06/08/2017   Shoulder fracture, left 05/03/2015   Past Medical History:  Diagnosis Date   Arthritis    "left ankle" (05/04/2015)   Asthma    Carpal tunnel syndrome    Chronic bronchitis (HCC)    Chronic lower back pain    Elevated liver enzymes    Environmental allergies    Family history of adverse reaction to anesthesia    "daughter died after 2nd epidural for C-section; they were able to bring her back"   GERD (gastroesophageal reflux disease)    History of kidney stones    History of TMJ syndrome    Hyperlipidemia    Hypertension    Kidney stones    Restless leg syndrome    Seasonal allergies     Family History  Problem Relation Age of Onset   Tuberculosis Mother    Heart disease Mother    Cancer Father     Past Surgical History:  Procedure Laterality Date   ABDOMINAL HYSTERECTOMY     ANKLE SURGERY Left    severed artery and tendons   APPENDECTOMY     CYSTOSCOPY W/ STONE MANIPULATION  X 1   FRACTURE SURGERY     JOINT REPLACEMENT  ORIF HUMERUS FRACTURE Left 05/03/2015   Procedure: OPEN REDUCTION INTERNAL FIXATION (ORIF) LEFT PROXIMAL HUMERUS FRACTURE NONUNION;  Surgeon: Cammy Copa, MD;  Location: [facility] OR;  Service: Orthopedics;  Laterality: Left;   TONSILLECTOMY     TOTAL HIP ARTHROPLASTY Left 12/29/2017   TOTAL HIP ARTHROPLASTY Left 12/29/2017   Procedure: LEFT TOTAL HIP ARTHROPLASTY ANTERIOR APPROACH;  Surgeon: Cammy Copa, MD;  Location: [facility] OR;  Service: Orthopedics;  Laterality: Left;   Social History   Occupational History   Not on file  Tobacco Use   Smoking status: Every Day    Current packs/day: 1.00    Average packs/day: 1 pack/day for 18.0 years (18.0 ttl pk-yrs)    Types: Cigarettes   Smokeless tobacco: Never  Vaping Use   Vaping status: Former   Devices: patient states only used one time  Substance  and Sexual Activity   Alcohol use: Yes    Alcohol/week: 1.0 - 2.0 standard drink of alcohol    Types: 1 - 2 Glasses of wine per week   Drug use: No   Sexual activity: Not Currently

## 2023-06-02 ENCOUNTER — Ambulatory Visit: Attending: Orthopaedic Surgery | Admitting: Occupational Therapy

## 2023-06-02 DIAGNOSIS — M6281 Muscle weakness (generalized): Secondary | ICD-10-CM

## 2023-06-02 DIAGNOSIS — M25632 Stiffness of left wrist, not elsewhere classified: Secondary | ICD-10-CM | POA: Diagnosis present

## 2023-06-02 DIAGNOSIS — S52572A Other intraarticular fracture of lower end of left radius, initial encounter for closed fracture: Secondary | ICD-10-CM | POA: Diagnosis not present

## 2023-06-02 DIAGNOSIS — M25532 Pain in left wrist: Secondary | ICD-10-CM | POA: Diagnosis present

## 2023-06-02 DIAGNOSIS — R278 Other lack of coordination: Secondary | ICD-10-CM | POA: Diagnosis present

## 2023-06-02 NOTE — Patient Instructions (Signed)
   Circled 1, 3, 5, 6.  Instructions to complete progressing up to 25 reps, 4 times per day.

## 2023-06-02 NOTE — Therapy (Signed)
 OUTPATIENT OCCUPATIONAL THERAPY ORTHO EVALUATION  Patient Name: Cynthia Pace MRN: [id number] DOB:Jul 06, 1960, 63 y.o., female Today's Date: 06/02/2023  PCP: Gwenlyn Found, MD REFERRING PROVIDER: Tarry Kos, MD  END OF SESSION:  OT End of Session - 06/02/23 1331     Visit Number 1    Number of Visits 13    Date for OT Re-Evaluation 07/17/23    Authorization Type [employer] 2025    OT Start Time 1234    OT Stop Time 1318    OT Time Calculation (min) 44 min             Past Medical History:  Diagnosis Date   Arthritis    "left ankle" (05/04/2015)   Asthma    Carpal tunnel syndrome    Chronic bronchitis (HCC)    Chronic lower back pain    Elevated liver enzymes    Environmental allergies    Family history of adverse reaction to anesthesia    "daughter died after 2nd epidural for C-section; they were able to bring her back"   GERD (gastroesophageal reflux disease)    History of kidney stones    History of TMJ syndrome    Hyperlipidemia    Hypertension    Kidney stones    Restless leg syndrome    Seasonal allergies    Past Surgical History:  Procedure Laterality Date   ABDOMINAL HYSTERECTOMY     ANKLE SURGERY Left    severed artery and tendons   APPENDECTOMY     CYSTOSCOPY W/ STONE MANIPULATION  X 1   FRACTURE SURGERY     JOINT REPLACEMENT     ORIF HUMERUS FRACTURE Left 05/03/2015   Procedure: OPEN REDUCTION INTERNAL FIXATION (ORIF) LEFT PROXIMAL HUMERUS FRACTURE NONUNION;  Surgeon: Cammy Copa, MD;  Location: [facility] OR;  Service: Orthopedics;  Laterality: Left;   TONSILLECTOMY     TOTAL HIP ARTHROPLASTY Left 12/29/2017   TOTAL HIP ARTHROPLASTY Left 12/29/2017   Procedure: LEFT TOTAL HIP ARTHROPLASTY ANTERIOR APPROACH;  Surgeon: Cammy Copa, MD;  Location: [facility] OR;  Service: Orthopedics;  Laterality: Left;   Patient Active Problem List   Diagnosis Date Noted   Closed fracture of left distal radius 04/22/2023   Avascular necrosis of bone of  left hip (HCC)    Hip arthritis 12/29/2017   Lumbar pain 06/08/2017   Shoulder fracture, left 05/03/2015    ONSET DATE: fx 04/14/23 (referral date 05/28/23)  REFERRING DIAG: N82.956O (ICD-10-CM) - Other closed intra-articular fracture of distal end of left radius, initial encounter M25.532 (ICD-10-CM) - Pain in left wrist  THERAPY DIAG:  Pain in left wrist  Stiffness of left wrist, not elsewhere classified  Muscle weakness (generalized)  Other lack of coordination  Rationale for Evaluation and Treatment: Rehabilitation  SUBJECTIVE:   SUBJECTIVE STATEMENT: Pt reports heading to bed and was reaching for the bed when she fell trying, results in fracture due to attempting to brace herself with her arm.  "My main goal is to get back onto the golf course". Pt accompanied by: self  PERTINENT HISTORY: 63 year old female who presented to ED 04/14/23 with left wrist pain.  Patient reports that she tripped and fell catching herself on her left wrist.  She did have some alcohol tonight.  X-rays reviewed and show distal radius fracture with an ulnar styloid fracture.  Patient was placed in a sugar-tong splint and given a sling.  Cast removed by Dr. Roda Shutters 05/28/23 and prefabricated wrist orthosis donned.  PRECAUTIONS:  None  RED FLAGS: None   WEIGHT BEARING RESTRICTIONS: No  PAIN:  Are you having pain? Yes: NPRS scale: 3-4/10 Pain location: ulnar side of wrist Pain description: dull, throbbing Aggravating factors: using it Relieving factors: Advil  FALLS: Has patient fallen in last 6 months? Yes. 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  "This was the first and only fall this year".  LIVING ENVIRONMENT: Lives with: lives with their spouse Lives in: House/apartment Stairs: Yes: Internal: full flights of steps; and External: threshold steps Has following equipment at home: Walker  - 2 wheeled - not currently using it  PLOF: Independent, Independent with basic ADLs, Independent with community mobility without device, Independent with homemaking with ambulation, and Leisure: playing golf  PATIENT GOALS: to be able to swing a club, decreased pain with household chores  NEXT MD VISIT: 07/09/23  OBJECTIVE:  Note: Objective measures were completed at Evaluation unless otherwise noted.  HAND DOMINANCE: Right  ADLs: Overall ADLs: difficulty opening pill bottles, difficulty with getting dressed due to decreased grasp and pain  Grooming: decreased strength to push toothpaste Upper body dressing: clothing fasteners Lower body dressing: mild difficulty and pain with pulling up pants  FUNCTIONAL OUTCOME MEASURES: Quick Dash: 54.5%   UPPER EXTREMITY ROM:     Active ROM Right eval Left eval  Shoulder flexion    Shoulder abduction    Shoulder adduction    Shoulder extension    Shoulder internal rotation    Shoulder external rotation    Elbow flexion    Elbow extension    Wrist flexion  80-90 60 12  Wrist extension  60-80 55 28  Wrist ulnar deviation  30-45 42 18  Wrist radial deviation 15-20 15 8   Wrist pronation WFL Pain  Wrist supination WFL 80% with increased pain  (Blank rows = not tested)  HAND FUNCTION: TBD - loose fist on L  COORDINATION: TBD  SENSATION: WFL  EDEMA: mild swelling in dorsal and volar aspect of L wrist  COGNITION: Overall cognitive status: Within functional limits for tasks assessed   TREATMENT DATE:  06/02/23 Educated on sleep positioning with hand elevated on pillow/above heart to allow for decreased swelling and pain             AROM: OT instructed pt in wrist flexion/extension with forearm resting on table top and with elbow propped on table top, forearm supination with elbow flexed and pronation with elbow extended, and forearm supination/pronation with elbow at 90*.   Orthosis: OT educated on wear of wrist orthosis at all  times with plan to decrease over the next ~2 weeks.  OT educating on removal of splint for bathing and exercises, but ensuring that she wears it at night time, during repetitive movements, and when out in the community.      PATIENT EDUCATION: Education details: Educated on role and purpose of OT as well as potential interventions and goals for therapy based on initial evaluation findings. Person educated: Patient Education method: Explanation, Demonstration, and Handouts Education comprehension: verbalized understanding and needs further education  HOME EXERCISE PROGRAM: 06/02/23 - AROM (wrist flexion/extension, supination/pronation) see pt instructions  GOALS: Goals reviewed with patient? Yes  SHORT TERM GOALS: Target date: 06/26/23  Pt will be independent in AROM, PROM, and strengthening HEP with use of handouts. Baseline: new to OPOT Goal status: INITIAL  2.  Pt  will be independent with splint wear and care and report understanding of progressive decrease in wear time of orthosis and/or times when she may continue to benefit from wear of splint. Baseline: wrist orthosis provided by MD Goal status: INITIAL  3.  Pt will verbalize understanding of improved sleep positioning to decrease onset of pain and tingling as well as reduction of edema. Baseline: pain, tingling, and swelling after sleep Goal status: INITIAL  4.  Pt will verbalize understanding of use of modalities to decrease inflammation/pain.  Baseline: new to OPOT Goal status: INITIAL  5.  Pt will verbalize understanding of task modifications and/or potential AE needs to increase ease, safety, and independence with IADLs.  Baseline: pain with cooking, chopping foods Goal status: INITIAL   LONG TERM GOALS: Target date: 07/17/23  Pt will demonstrate improved UE functional use for ADLs as evidenced by increasing box/ blocks score by 5 blocks with LUE. Baseline: TBD Goal status: INITIAL  2.  Pt will improve A/ROM in left  wrist for flexion and extension to at least 90% as compared to right, to have functional motion for tasks like reach and grasp.  Baseline: LUE ROM at 50% or less of RUE Goal status: INITIAL  3.  Pt will demonstrate improved grip strength to >30 #  with LUE to demonstrate functional grasp. Baseline: TBD Goal status: INITIAL  4.  Pt will report improved functional use of LUE as evidenced by decreased score on QuickDASH by 25% Baseline: 54.5% impairment Goal status: INITIAL  5.  Pt will decrease pain at worst from 5/10 to 1/10 or less during grasp and release and power grip in preparation for being able to play pain free golf with left hand and occupational participation in daily roles. Baseline:  Goal status: INITIAL   ASSESSMENT:  CLINICAL IMPRESSION: Patient is a 63 y.o. female who was seen today for occupational therapy evaluation for distal radius fracture. Pt currently presents with mild edema, stiffness, and pain limiting ability to engage in ADLs, IADLs, and leisure tasks at [facility].  Pt will benefit from skilled occupational therapy services to address strength and coordination, ROM, pain management, GM/FM control, safety awareness, introduction of compensatory strategies/AE prn, and implementation of an HEP to improve participation and safety during ADLs, IADLs, and leisure pursuits.    PERFORMANCE DEFICITS: in functional skills including ADLs, IADLs, coordination, edema, ROM, strength, pain, flexibility, Gross motor control, body mechanics, decreased knowledge of precautions, and UE functional use and psychosocial skills including environmental adaptation and routines and behaviors.   IMPAIRMENTS: are limiting patient from ADLs, IADLs, rest and sleep, and leisure.   COMORBIDITIES: may have co-morbidities  that affects occupational performance. Patient will benefit from skilled OT to address above impairments and improve overall function.  MODIFICATION OR ASSISTANCE TO COMPLETE  EVALUATION: No modification of tasks or assist necessary to complete an evaluation.  OT OCCUPATIONAL PROFILE AND HISTORY: Problem focused assessment: Including review of records relating to presenting problem.  CLINICAL DECISION MAKING: LOW - limited treatment options, no task modification necessary  REHAB POTENTIAL: Good  EVALUATION COMPLEXITY: Low      PLAN:  OT FREQUENCY: 2x/week  OT DURATION: 6 weeks  PLANNED INTERVENTIONS: 97168 OT Re-evaluation, 97535 self care/ADL training, 16109 therapeutic exercise, 97530 therapeutic activity, 97112 neuromuscular re-education, 97140 manual therapy, 97035 ultrasound, 97018 paraffin, 60454 moist heat, 97010 cryotherapy, 97760 Orthotics management and training, 09811 Splinting (initial encounter), [id number] Subsequent splinting/medication, passive range of motion, compression bandaging, coping strategies training, patient/family education, and DME and/or  AE instructions  RECOMMENDED OTHER SERVICES: NA  CONSULTED AND AGREED WITH PLAN OF CARE: Patient  PLAN FOR NEXT SESSION: Review AROM, attempt PROM exercises, okay to begin hand strengthen with putty or hand exerciser  Referring diagnosis? S52.572A (ICD-10-CM) - Other closed intra-articular fracture of distal end of left radius, initial encounter M25.532 (ICD-10-CM) - Pain in left wrist Treatment diagnosis? (if different than referring diagnosis) Pain in left wrist  Stiffness of left wrist, not elsewhere classified  Muscle weakness (generalized)  Other lack of coordination What was this (referring dx) caused by? []  Surgery [x]  Fall []  Ongoing issue []  Arthritis []  Other: ____________  Laterality: []  Rt [x]  Lt []  Both  Check all possible CPT codes:  *CHOOSE 10 OR LESS*    See Planned Interventions listed in the Plan section of the Evaluation.     Rosalio Loud, OTR/L 06/02/2023, 1:33 PM  [facility] [address] [place], [address] [place], [place] [address] Phone # [phone] Fax # [phone]

## 2023-06-10 ENCOUNTER — Ambulatory Visit: Admitting: Occupational Therapy

## 2023-06-10 DIAGNOSIS — R278 Other lack of coordination: Secondary | ICD-10-CM

## 2023-06-10 DIAGNOSIS — M25532 Pain in left wrist: Secondary | ICD-10-CM | POA: Diagnosis not present

## 2023-06-10 DIAGNOSIS — M6281 Muscle weakness (generalized): Secondary | ICD-10-CM

## 2023-06-10 DIAGNOSIS — M25632 Stiffness of left wrist, not elsewhere classified: Secondary | ICD-10-CM

## 2023-06-10 NOTE — Patient Instructions (Addendum)
    OT added the following exercise to HEP from previously provided handout (exercise #4). OT provided additional copy of full handout per pt request (see 06/02/23 pt instructions):  20 second hold, 3 reps, 3x per day. Avoid sharp pain. Only complete reps as prescribed.

## 2023-06-10 NOTE — Therapy (Signed)
 OUTPATIENT OCCUPATIONAL THERAPY ORTHO Treatment  Patient Name: Cynthia Pace MRN: [id number] DOB:August 05, 1960, 63 y.o., female Today's Date: 06/10/2023  PCP: Gwenlyn Found, MD REFERRING PROVIDER: Tarry Kos, MD  END OF SESSION:  OT End of Session - 06/10/23 1341     Visit Number 2    Number of Visits 13    Date for OT Re-Evaluation 07/17/23    Authorization Type [employer] 2025    OT Start Time 1020    OT Stop Time 1104    OT Time Calculation (min) 44 min    Activity Tolerance Patient tolerated treatment well    Behavior During Therapy WFL for tasks assessed/performed              Past Medical History:  Diagnosis Date   Arthritis    "left ankle" (05/04/2015)   Asthma    Carpal tunnel syndrome    Chronic bronchitis (HCC)    Chronic lower back pain    Elevated liver enzymes    Environmental allergies    Family history of adverse reaction to anesthesia    "daughter died after 2nd epidural for C-section; they were able to bring her back"   GERD (gastroesophageal reflux disease)    History of kidney stones    History of TMJ syndrome    Hyperlipidemia    Hypertension    Kidney stones    Restless leg syndrome    Seasonal allergies    Past Surgical History:  Procedure Laterality Date   ABDOMINAL HYSTERECTOMY     ANKLE SURGERY Left    severed artery and tendons   APPENDECTOMY     CYSTOSCOPY W/ STONE MANIPULATION  X 1   FRACTURE SURGERY     JOINT REPLACEMENT     ORIF HUMERUS FRACTURE Left 05/03/2015   Procedure: OPEN REDUCTION INTERNAL FIXATION (ORIF) LEFT PROXIMAL HUMERUS FRACTURE NONUNION;  Surgeon: Cammy Copa, MD;  Location: [facility] OR;  Service: Orthopedics;  Laterality: Left;   TONSILLECTOMY     TOTAL HIP ARTHROPLASTY Left 12/29/2017   TOTAL HIP ARTHROPLASTY Left 12/29/2017   Procedure: LEFT TOTAL HIP ARTHROPLASTY ANTERIOR APPROACH;  Surgeon: Cammy Copa, MD;  Location: [facility] OR;  Service: Orthopedics;  Laterality: Left;   Patient Active  Problem List   Diagnosis Date Noted   Closed fracture of left distal radius 04/22/2023   Avascular necrosis of bone of left hip (HCC)    Hip arthritis 12/29/2017   Lumbar pain 06/08/2017   Shoulder fracture, left 05/03/2015    ONSET DATE: fx 04/14/23 (referral date 05/28/23)  REFERRING DIAG: W09.811B (ICD-10-CM) - Other closed intra-articular fracture of distal end of left radius, initial encounter M25.532 (ICD-10-CM) - Pain in left wrist  THERAPY DIAG:  Pain in left wrist  Stiffness of left wrist, not elsewhere classified  Muscle weakness (generalized)  Other lack of coordination  Rationale for Evaluation and Treatment: Rehabilitation  SUBJECTIVE:   SUBJECTIVE STATEMENT: Pt reported has not yet removed splint today and has not used L hand much today. Pt reported PCP appointment later today. Pt reported both hands often "asleep" when waking up in A.M.  Pt accompanied by: self  PERTINENT HISTORY: 63 year old female who presented to ED 04/14/23 with left wrist pain.  Patient reports that she tripped and fell catching herself on her left wrist.  She did have some alcohol tonight.  X-rays reviewed and show distal radius fracture with an ulnar styloid fracture.  Patient was placed in a sugar-tong splint and given a  sling.  Cast removed by Dr. Roda Shutters 05/28/23 and prefabricated wrist orthosis donned.  PRECAUTIONS: None  RED FLAGS: None   WEIGHT BEARING RESTRICTIONS: No  PAIN:  Are you having pain? Yes: NPRS scale: 2/10 Pain location: radial and ulnar side of wrist Pain description: dull Aggravating factors: using it Relieving factors: Advil  FALLS: Has patient fallen in last 6 months? Yes. 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  "This was the first and only fall this year".  LIVING ENVIRONMENT: Lives with: lives with their spouse Lives in: House/apartment Stairs:  Yes: Internal: full flights of steps; and External: threshold steps Has following equipment at home: Walker - 2 wheeled - not currently using it  PLOF: Independent, Independent with basic ADLs, Independent with community mobility without device, Independent with homemaking with ambulation, and Leisure: playing golf  PATIENT GOALS: to be able to swing a club, decreased pain with household chores  NEXT MD VISIT: 07/09/23  OBJECTIVE:  Note: Objective measures were completed at Evaluation unless otherwise noted.  HAND DOMINANCE: Right  ADLs: Overall ADLs: difficulty opening pill bottles, difficulty with getting dressed due to decreased grasp and pain  Grooming: decreased strength to push toothpaste Upper body dressing: clothing fasteners Lower body dressing: mild difficulty and pain with pulling up pants  FUNCTIONAL OUTCOME MEASURES: Quick Dash: 54.5%   UPPER EXTREMITY ROM:     Active ROM Right eval Left eval  Shoulder flexion    Shoulder abduction    Shoulder adduction    Shoulder extension    Shoulder internal rotation    Shoulder external rotation    Elbow flexion    Elbow extension    Wrist flexion  80-90 60 12  Wrist extension  60-80 55 28  Wrist ulnar deviation  30-45 42 18  Wrist radial deviation 15-20 15 8   Wrist pronation WFL Pain  Wrist supination WFL 80% with increased pain  (Blank rows = not tested)  HAND FUNCTION: TBD - loose fist on L  COORDINATION: TBD  SENSATION: WFL  EDEMA: mild swelling in dorsal and volar aspect of L wrist  COGNITION: Overall cognitive status: Within functional limits for tasks assessed   TREATMENT DATE:   Self-Care OT educated pt on sleep positioning strategies. Handout provided, see pt instructions. Pt verbalized understanding.  OT educated pt on UE anatomy and nerve anatomy, prognosis, and dx. Pt verbalized understanding.  OT educated pt on weaning brace: OT recommended to pt to remove at night, remove for 4-6 hours  during light ADLs in the home, continue to wear during repetitive activities and heavy activities and when out in community. Pt verbalized understanding. Pt asked about attempting to putt and chip with golf clubs. OT educated pt on current precautions based on protocol and to avoid heavy activity at affected hand. Pt verbalized understanding.   OT educated pt on ROM HEP and recommended to complete AROM exercises up to 4 times per day, 25 reps. Pt verbalized understanding.   OT educated pt on caution when return to functional activities, listening to body, joint protection, avoiding "overdoing" exercises and activity to prevent pain, graded return to activity, breaking up cooking tasks and taking breaks PRN. Pt verbalized understanding.  TherAct OT initiated theraputty (yellow) - to improve strengthening, FM coordination and dexterity of affected UE. Access Code: [email or web address] URL: [email or web address] Date: 06/10/2023 Prepared by: Carilyn Goodpasture  Exercises -  Putty Squeezes  - 3 x daily - 1 sets - 10 reps - Rolling Putty on Table  - 3 x daily - 1 sets - 10 reps - Tip Pinch with Putty  - 3 x daily - 1 sets - 10 reps - Seated Three Finger Pinch with Putty  - 3 x daily - 1 sets - 10 reps - Finger Key Grip with Putty  - 3 x daily - 1 sets - 10 reps  OT educated pt on completing only reps as prescribed and as tolerated, importance of attending to pain threshold and avoiding sharp pain, avoiding distractions. Pt demo'd some distractibility throughout session during exercises and completed additional reps despite v/c and education. OT reiterated importance of only completing reps as prescribed. Pt verbalized understanding.   TherEx OT assessed digit AROM. Pt returned demo of tendon glides without difficulty. OT assessed wrist AROM. Pt demo'd stiffness of affected wrist flex/ext and circumduction.   OT reviewed previous HEP. Pt returned demo with min v/c for positioning.   Wrist ext prayer  stretch AAROM - exercise #4 on previously provided handout (see pt instructions)  - to improve affected UE AAROM. - 20 seconds, 3 reps, 4x per day. Pt benefited from v/c to avoid sharp pain, only hold for 20 seconds.    PATIENT EDUCATION: Education details: see today's tx above Person educated: Patient Education method: Explanation, Demonstration, and Handouts Education comprehension: verbalized understanding and needs further education  HOME EXERCISE PROGRAM: 06/02/23 - AROM (wrist flexion/extension, supination/pronation) see pt instructions 06/10/23 - yellow theraputty, Access Code: [email or web address]. Wrist prayer stretch AAROM (see pt instructions)  GOALS: Goals reviewed with patient? Yes  SHORT TERM GOALS: Target date: 06/26/23  Pt will be independent in AROM, PROM, and strengthening HEP with use of handouts. Baseline: new to OPOT Goal status: in progress  2.  Pt will be independent with splint wear and care and report understanding of progressive decrease in wear time of orthosis and/or times when she may continue to benefit from wear of splint. Baseline: wrist orthosis provided by MD Goal status: in progress  3.  Pt will verbalize understanding of improved sleep positioning to decrease onset of pain and tingling as well as reduction of edema. Baseline: pain, tingling, and swelling after sleep Goal status: in progress  4.  Pt will verbalize understanding of use of modalities to decrease inflammation/pain.  Baseline: new to OPOT Goal status: INITIAL  5.  Pt will verbalize understanding of task modifications and/or potential AE needs to increase ease, safety, and independence with IADLs.  Baseline: pain with cooking, chopping foods Goal status: in progress   LONG TERM GOALS: Target date: 07/17/23  Pt will demonstrate improved UE functional use for ADLs as evidenced by increasing box/ blocks score by 5 blocks with LUE. Baseline: TBD Goal status: INITIAL  2.  Pt will improve A/ROM in  left wrist for flexion and extension to at least 90% as compared to right, to have functional motion for tasks like reach and grasp.  Baseline: LUE ROM at 50% or less of RUE Goal status: INITIAL  3.  Pt will demonstrate improved grip strength to >30 #  with LUE to demonstrate functional grasp. Baseline: TBD Goal status: INITIAL  4.  Pt will report improved functional use of LUE as evidenced by decreased score on QuickDASH by 25% Baseline: 54.5% impairment Goal status: INITIAL  5.  Pt will decrease pain at worst from 5/10 to 1/10 or less during grasp and release and power  grip in preparation for being able to play pain free golf with left hand and occupational participation in daily roles. Baseline:  Goal status: INITIAL   ASSESSMENT:  CLINICAL IMPRESSION: Pt tolerated tasks well though benefited from frequent v/c and education regarding importance of only completing reps as prescribed, attending to pain threshold, and graded return to functional activities per protocol. OT to monitor to ensure carryover of education and monitor pt's pain threshold with graded return to functional tasks.  Pt will benefit from skilled occupational therapy services to address strength and coordination, ROM, pain management, GM/FM control, safety awareness, introduction of compensatory strategies/AE prn, and implementation of an HEP to improve participation and safety during ADLs, IADLs, and leisure pursuits.    PERFORMANCE DEFICITS: in functional skills including ADLs, IADLs, coordination, edema, ROM, strength, pain, flexibility, Gross motor control, body mechanics, decreased knowledge of precautions, and UE functional use and psychosocial skills including environmental adaptation and routines and behaviors.   IMPAIRMENTS: are limiting patient from ADLs, IADLs, rest and sleep, and leisure.   COMORBIDITIES: may have co-morbidities  that affects occupational performance. Patient will benefit from skilled OT to  address above impairments and improve overall function.  MODIFICATION OR ASSISTANCE TO COMPLETE EVALUATION: No modification of tasks or assist necessary to complete an evaluation.  OT OCCUPATIONAL PROFILE AND HISTORY: Problem focused assessment: Including review of records relating to presenting problem.  CLINICAL DECISION MAKING: LOW - limited treatment options, no task modification necessary  REHAB POTENTIAL: Good  EVALUATION COMPLEXITY: Low      PLAN:  OT FREQUENCY: 2x/week  OT DURATION: 6 weeks  PLANNED INTERVENTIONS: 97168 OT Re-evaluation, 97535 self care/ADL training, 21308 therapeutic exercise, 97530 therapeutic activity, 97112 neuromuscular re-education, 97140 manual therapy, 97035 ultrasound, 97018 paraffin, 65784 moist heat, 97010 cryotherapy, 97760 Orthotics management and training, 69629 Splinting (initial encounter), [id number] Subsequent splinting/medication, passive range of motion, compression bandaging, coping strategies training, patient/family education, and DME and/or AE instructions  RECOMMENDED OTHER SERVICES: NA  CONSULTED AND AGREED WITH PLAN OF CARE: Patient  PLAN FOR NEXT SESSION: Review AROM/AAROM and theraputty, continue to progress AAROM/PROM exercises within pain threshold, okay to begin hand exerciser     Referring diagnosis? S52.572A (ICD-10-CM) - Other closed intra-articular fracture of distal end of left radius, initial encounter M25.532 (ICD-10-CM) - Pain in left wrist Treatment diagnosis? (if different than referring diagnosis) Pain in left wrist  Stiffness of left wrist, not elsewhere classified  Muscle weakness (generalized)  Other lack of coordination What was this (referring dx) caused by? []  Surgery [x]  Fall []  Ongoing issue []  Arthritis []  Other: ____________  Laterality: []  Rt [x]  Lt []  Both  Check all possible CPT codes:  *CHOOSE 10 OR LESS*    See Planned Interventions listed in the Plan section of the Evaluation.      Oakley Bellman, OTR/L 06/10/2023, 1:51 PM  [facility] [address], [address] [place], [place] [address] Phone # [phone] Fax # [phone]

## 2023-06-15 ENCOUNTER — Ambulatory Visit: Admitting: Occupational Therapy

## 2023-06-15 DIAGNOSIS — M25532 Pain in left wrist: Secondary | ICD-10-CM | POA: Diagnosis not present

## 2023-06-15 DIAGNOSIS — M25632 Stiffness of left wrist, not elsewhere classified: Secondary | ICD-10-CM

## 2023-06-15 NOTE — Patient Instructions (Addendum)
  5 reps, 3-4x/ day, hold 20 seconds

## 2023-06-15 NOTE — Therapy (Signed)
 OUTPATIENT OCCUPATIONAL THERAPY ORTHO Treatment  Patient Name: Cynthia Pace MRN: [id number] DOB:04/07/1960, 63 y.o., female Today's Date: 06/15/2023  PCP: Audria Leather, MD REFERRING PROVIDER: Wes Hamman, MD  END OF SESSION:  OT End of Session - 06/15/23 1116     Visit Number 3    Number of Visits 13    Date for OT Re-Evaluation 07/17/23    Authorization Type [employer] 2025    OT Start Time 1103    OT Stop Time 1145    OT Time Calculation (min) 42 min    Activity Tolerance Patient tolerated treatment well    Behavior During Therapy WFL for tasks assessed/performed               Past Medical History:  Diagnosis Date   Arthritis    "left ankle" (05/04/2015)   Asthma    Carpal tunnel syndrome    Chronic bronchitis (HCC)    Chronic lower back pain    Elevated liver enzymes    Environmental allergies    Family history of adverse reaction to anesthesia    "daughter died after 2nd epidural for C-section; they were able to bring her back"   GERD (gastroesophageal reflux disease)    History of kidney stones    History of TMJ syndrome    Hyperlipidemia    Hypertension    Kidney stones    Restless leg syndrome    Seasonal allergies    Past Surgical History:  Procedure Laterality Date   ABDOMINAL HYSTERECTOMY     ANKLE SURGERY Left    severed artery and tendons   APPENDECTOMY     CYSTOSCOPY W/ STONE MANIPULATION  X 1   FRACTURE SURGERY     JOINT REPLACEMENT     ORIF HUMERUS FRACTURE Left 05/03/2015   Procedure: OPEN REDUCTION INTERNAL FIXATION (ORIF) LEFT PROXIMAL HUMERUS FRACTURE NONUNION;  Surgeon: Jasmine Mesi, MD;  Location: [facility] OR;  Service: Orthopedics;  Laterality: Left;   TONSILLECTOMY     TOTAL HIP ARTHROPLASTY Left 12/29/2017   TOTAL HIP ARTHROPLASTY Left 12/29/2017   Procedure: LEFT TOTAL HIP ARTHROPLASTY ANTERIOR APPROACH;  Surgeon: Jasmine Mesi, MD;  Location: [facility] OR;  Service: Orthopedics;  Laterality: Left;   Patient Active  Problem List   Diagnosis Date Noted   Closed fracture of left distal radius 04/22/2023   Avascular necrosis of bone of left hip (HCC)    Hip arthritis 12/29/2017   Lumbar pain 06/08/2017   Shoulder fracture, left 05/03/2015    ONSET DATE: fx 04/14/23 (referral date 05/28/23)  REFERRING DIAG: W09.811B (ICD-10-CM) - Other closed intra-articular fracture of distal end of left radius, initial encounter M25.532 (ICD-10-CM) - Pain in left wrist  THERAPY DIAG:  Pain in left wrist  Stiffness of left wrist, not elsewhere classified  Rationale for Evaluation and Treatment: Rehabilitation  SUBJECTIVE:   SUBJECTIVE STATEMENT: Pt reports that she has been keeping the brace off.  She reports that she attempted to take the ham out of the refrigerator, pt reports "sharp, twinge" but it passed quickly.  Pt reports donning brace when setting out dishes and meal items due to repetitive use.    Pt accompanied by: self  PERTINENT HISTORY: 63 year old female who presented to ED 04/14/23 with left wrist pain.  Patient reports that she tripped and fell catching herself on her left wrist.  She did have some alcohol tonight.  X-rays reviewed and show distal radius fracture with an ulnar styloid fracture.  Patient  was placed in a sugar-tong splint and given a sling.  Cast removed by Dr. Christiane [name] 05/28/23 and prefabricated wrist orthosis donned.  PRECAUTIONS: None  RED FLAGS: None   WEIGHT BEARING RESTRICTIONS: No  PAIN:  Are you having pain? Yes: NPRS scale: 2/10 Pain location: radial and ulnar side of wrist Pain description: dull Aggravating factors: using it Relieving factors: Advil  FALLS: Has patient fallen in last 6 months? Yes. 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  "This was the first and only fall this year".  LIVING ENVIRONMENT: Lives with: lives with their spouse Lives in:  House/apartment Stairs: Yes: Internal: full flights of steps; and External: threshold steps Has following equipment at home: Walker - 2 wheeled - not currently using it  PLOF: Independent, Independent with basic ADLs, Independent with community mobility without device, Independent with homemaking with ambulation, and Leisure: playing golf  PATIENT GOALS: to be able to swing a club, decreased pain with household chores  NEXT MD VISIT: 07/09/23  OBJECTIVE:  Note: Objective measures were completed at Evaluation unless otherwise noted.  HAND DOMINANCE: Right  ADLs: Overall ADLs: difficulty opening pill bottles, difficulty with getting dressed due to decreased grasp and pain  Grooming: decreased strength to push toothpaste Upper body dressing: clothing fasteners Lower body dressing: mild difficulty and pain with pulling up pants  FUNCTIONAL OUTCOME MEASURES: Quick Dash: 54.5%   UPPER EXTREMITY ROM:     Active ROM Right eval Left eval  Shoulder flexion    Shoulder abduction    Shoulder adduction    Shoulder extension    Shoulder internal rotation    Shoulder external rotation    Elbow flexion    Elbow extension    Wrist flexion  80-90 60 12  Wrist extension  60-80 55 28  Wrist ulnar deviation  30-45 42 18  Wrist radial deviation 15-20 15 8   Wrist pronation WFL Pain  Wrist supination WFL 80% with increased pain  (Blank rows = not tested)  HAND FUNCTION: TBD - loose fist on L  COORDINATION: TBD  SENSATION: WFL  EDEMA: mild swelling in dorsal and volar aspect of L wrist  COGNITION: Overall cognitive status: Within functional limits for tasks assessed   TREATMENT DATE:  06/15/23 ROM: reviewed wrist extension/prayer stretch with OT providing demonstration and cues for technique to facilitate increased wrist extension.  Pt completing x3 with hold for 20 seconds.  OT reiterating education of completing only reps as prescribed and as tolerated.  OT instructed pt in  wrist flexion and wrist extension x5 with opposite UE to facilitate increased ROM.  OT instructing pt to hold position for 15 seconds each rep.  OT providing intermittent demonstration and cues for improved OT instructed pt in forearm supination and extension initially with AROM followed by assist from opposite UE to facilitate increased ROM.  Completed x5 bilaterally.   Grip strength:  with light resistance digiflex.  Pt completed x10, noting mild decrease in incorporation of index finger into full grasp.   PATIENT EDUCATION: Education details: see today's tx above Person educated: Patient Education method: Explanation, Demonstration, and Handouts Education comprehension: verbalized understanding and needs further education  HOME EXERCISE PROGRAM: 06/02/23 - AROM (wrist flexion/extension, supination/pronation) see pt instructions 06/10/23 - yellow theraputty, Access Code: [email or web address]. Wrist prayer stretch AAROM (see pt instructions)  GOALS: Goals reviewed with patient? Yes  SHORT TERM  GOALS: Target date: 06/26/23  Pt will be independent in AROM, PROM, and strengthening HEP with use of handouts. Baseline: new to OPOT Goal status: in progress  2.  Pt will be independent with splint wear and care and report understanding of progressive decrease in wear time of orthosis and/or times when she may continue to benefit from wear of splint. Baseline: wrist orthosis provided by MD Goal status: in progress  3.  Pt will verbalize understanding of improved sleep positioning to decrease onset of pain and tingling as well as reduction of edema. Baseline: pain, tingling, and swelling after sleep Goal status: in progress  4.  Pt will verbalize understanding of use of modalities to decrease inflammation/pain.  Baseline: new to OPOT Goal status: INITIAL  5.  Pt will verbalize understanding of task modifications and/or potential AE needs to increase ease, safety, and independence with IADLs.  Baseline: pain  with cooking, chopping foods Goal status: in progress   LONG TERM GOALS: Target date: 07/17/23  Pt will demonstrate improved UE functional use for ADLs as evidenced by increasing box/ blocks score by 5 blocks with LUE. Baseline: TBD Goal status: INITIAL  2.  Pt will improve A/ROM in left wrist for flexion and extension to at least 90% as compared to right, to have functional motion for tasks like reach and grasp.  Baseline: LUE ROM at 50% or less of RUE Goal status: INITIAL  3.  Pt will demonstrate improved grip strength to >30 #  with LUE to demonstrate functional grasp. Baseline: TBD Goal status: INITIAL  4.  Pt will report improved functional use of LUE as evidenced by decreased score on QuickDASH by 25% Baseline: 54.5% impairment Goal status: INITIAL  5.  Pt will decrease pain at worst from 5/10 to 1/10 or less during grasp and release and power grip in preparation for being able to play pain free golf with left hand and occupational participation in daily roles. Baseline:  Goal status: INITIAL   ASSESSMENT:  CLINICAL IMPRESSION: Pt tolerated tasks well though benefited from frequent v/c and education regarding importance of only completing reps as prescribed and attending to pain threshold. Pt with reports of not completing exercises since last visit, therefore reiterated importance of exercises and reviewed majority of exercises this session.  OT to monitor to ensure carryover of education and monitor pt's pain threshold with graded return to functional tasks.  Pt will continue to benefit from skilled occupational therapy services to address strength and coordination, ROM, pain management, GM/FM control, safety awareness, introduction of compensatory strategies/AE prn, and implementation of an HEP to improve participation and safety during ADLs, IADLs, and leisure pursuits.    PERFORMANCE DEFICITS: in functional skills including ADLs, IADLs, coordination, edema, ROM, strength,  pain, flexibility, Gross motor control, body mechanics, decreased knowledge of precautions, and UE functional use and psychosocial skills including environmental adaptation and routines and behaviors.      PLAN:  OT FREQUENCY: 2x/week  OT DURATION: 6 weeks  PLANNED INTERVENTIONS: 97168 OT Re-evaluation, 97535 self care/ADL training, 16109 therapeutic exercise, 97530 therapeutic activity, 97112 neuromuscular re-education, 97140 manual therapy, 97035 ultrasound, 97018 paraffin, 60454 moist heat, 97010 cryotherapy, 97760 Orthotics management and training, 09811 Splinting (initial encounter), [id number] Subsequent splinting/medication, passive range of motion, compression bandaging, coping strategies training, patient/family education, and DME and/or AE instructions  RECOMMENDED OTHER SERVICES: NA  CONSULTED AND AGREED WITH PLAN OF CARE: Patient  PLAN FOR NEXT SESSION: Review AROM/AAROM and theraputty, continue to progress AAROM/PROM exercises within pain  threshold, okay to begin hand exerciser     Anthonette Kinsman, OTR/L 06/15/2023, 12:49 PM  [facility] at [facility] [address] [place], [address] [place], [place] [address] Phone # [phone] Fax # [phone]

## 2023-06-17 ENCOUNTER — Ambulatory Visit: Admitting: Occupational Therapy

## 2023-06-17 DIAGNOSIS — M25532 Pain in left wrist: Secondary | ICD-10-CM | POA: Diagnosis not present

## 2023-06-17 DIAGNOSIS — M25632 Stiffness of left wrist, not elsewhere classified: Secondary | ICD-10-CM

## 2023-06-17 DIAGNOSIS — R278 Other lack of coordination: Secondary | ICD-10-CM

## 2023-06-17 DIAGNOSIS — M6281 Muscle weakness (generalized): Secondary | ICD-10-CM

## 2023-06-17 NOTE — Patient Instructions (Signed)
 Cynthia Pace

## 2023-06-17 NOTE — Therapy (Unsigned)
 OUTPATIENT OCCUPATIONAL THERAPY ORTHO Treatment  Patient Name: Cynthia Pace MRN: [id number] DOB:Jul 22, 1960, 63 y.o., female Today's Date: 06/18/2023  PCP: Audria Leather, MD REFERRING PROVIDER: Wes Hamman, MD  END OF SESSION:  OT End of Session - 06/17/23 1635     Visit Number 4    Number of Visits 13    Date for OT Re-Evaluation 07/17/23    Authorization Type [employer] 2025    OT Start Time 1406    OT Stop Time 1445    OT Time Calculation (min) 39 min    Activity Tolerance Patient tolerated treatment well    Behavior During Therapy WFL for tasks assessed/performed                Past Medical History:  Diagnosis Date   Arthritis    "left ankle" (05/04/2015)   Asthma    Carpal tunnel syndrome    Chronic bronchitis (HCC)    Chronic lower back pain    Elevated liver enzymes    Environmental allergies    Family history of adverse reaction to anesthesia    "daughter died after 2nd epidural for C-section; they were able to bring her back"   GERD (gastroesophageal reflux disease)    History of kidney stones    History of TMJ syndrome    Hyperlipidemia    Hypertension    Kidney stones    Restless leg syndrome    Seasonal allergies    Past Surgical History:  Procedure Laterality Date   ABDOMINAL HYSTERECTOMY     ANKLE SURGERY Left    severed artery and tendons   APPENDECTOMY     CYSTOSCOPY W/ STONE MANIPULATION  X 1   FRACTURE SURGERY     JOINT REPLACEMENT     ORIF HUMERUS FRACTURE Left 05/03/2015   Procedure: OPEN REDUCTION INTERNAL FIXATION (ORIF) LEFT PROXIMAL HUMERUS FRACTURE NONUNION;  Surgeon: Jasmine Mesi, MD;  Location: [facility] OR;  Service: Orthopedics;  Laterality: Left;   TONSILLECTOMY     TOTAL HIP ARTHROPLASTY Left 12/29/2017   TOTAL HIP ARTHROPLASTY Left 12/29/2017   Procedure: LEFT TOTAL HIP ARTHROPLASTY ANTERIOR APPROACH;  Surgeon: Jasmine Mesi, MD;  Location: [facility] OR;  Service: Orthopedics;  Laterality: Left;   Patient Active  Problem List   Diagnosis Date Noted   Closed fracture of left distal radius 04/22/2023   Avascular necrosis of bone of left hip (HCC)    Hip arthritis 12/29/2017   Lumbar pain 06/08/2017   Shoulder fracture, left 05/03/2015    ONSET DATE: fx 04/14/23 (referral date 05/28/23)  REFERRING DIAG: W09.811B (ICD-10-CM) - Other closed intra-articular fracture of distal end of left radius, initial encounter M25.532 (ICD-10-CM) - Pain in left wrist  THERAPY DIAG:  Pain in left wrist  Stiffness of left wrist, not elsewhere classified  Muscle weakness (generalized)  Other lack of coordination  Rationale for Evaluation and Treatment: Rehabilitation  SUBJECTIVE:   SUBJECTIVE STATEMENT: Pt reported wearing L wrist brace for driving. Pt reported purchasing new wrist brace without metal pieces. Pt reported "I will return to golf sooner than y'all think." Pt reported did not complete putty exercises d/t busy over the weekend and cooking tasks.  Pt accompanied by: self  PERTINENT HISTORY: 64 year old female who presented to ED 04/14/23 with left wrist pain.  Patient reports that she tripped and fell catching herself on her left wrist.  She did have some alcohol tonight.  X-rays reviewed and show distal radius fracture with an ulnar styloid  fracture.  Patient was placed in a sugar-tong splint and given a sling.  Cast removed by Dr. Christiane [name] 05/28/23 and prefabricated wrist orthosis donned.  PRECAUTIONS: None  RED FLAGS: None   WEIGHT BEARING RESTRICTIONS: No  PAIN:  Are you having pain? Yes: NPRS scale: 1-2/10 Pain location: radial and ulnar side of wrist Pain description: dull Aggravating factors: using it Relieving factors: Advil  FALLS: Has patient fallen in last 6 months? Yes. 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  "This was the first and only fall this year".  LIVING  ENVIRONMENT: Lives with: lives with their spouse Lives in: House/apartment Stairs: Yes: Internal: full flights of steps; and External: threshold steps Has following equipment at home: Walker - 2 wheeled - not currently using it  PLOF: Independent, Independent with basic ADLs, Independent with community mobility without device, Independent with homemaking with ambulation, and Leisure: playing golf  PATIENT GOALS: to be able to swing a club, decreased pain with household chores  NEXT MD VISIT: 07/09/23  OBJECTIVE:  Note: Objective measures were completed at Evaluation unless otherwise noted.  HAND DOMINANCE: Right  ADLs: Overall ADLs: difficulty opening pill bottles, difficulty with getting dressed due to decreased grasp and pain  Grooming: decreased strength to push toothpaste Upper body dressing: clothing fasteners Lower body dressing: mild difficulty and pain with pulling up pants  FUNCTIONAL OUTCOME MEASURES: Quick Dash: 54.5%   UPPER EXTREMITY ROM:     Active ROM Right eval Left eval  Shoulder flexion    Shoulder abduction    Shoulder adduction    Shoulder extension    Shoulder internal rotation    Shoulder external rotation    Elbow flexion    Elbow extension    Wrist flexion  80-90 60 12  Wrist extension  60-80 55 28  Wrist ulnar deviation  30-45 42 18  Wrist radial deviation 15-20 15 8   Wrist pronation WFL Pain  Wrist supination WFL 80% with increased pain  (Blank rows = not tested)  HAND FUNCTION: TBD - loose fist on L  COORDINATION: TBD  SENSATION: WFL  EDEMA: mild swelling in dorsal and volar aspect of L wrist  COGNITION: Overall cognitive status: Within functional limits for tasks assessed   TREATMENT DATE:   Self Care OT educated pt on protocol, precautions, advised to avoid playing golf until cleared by doctor, considerations and risks when playing golf if return too soon. Pt verbalized understanding though OT uncertain of compliance.  OT  educated pt on heat/cold modalities. Handout provided, see pt instructions. Pt verbalized understanding.   Per pt request, OT educated pt on paraffin wax modality option. OT educated pt on pros/cons of paraffin wax and home options. Pt acknowledged understanding.  Per pt request d/t pt having personal TENS unit at home, OT educated pt on TENS unit purpose for pain control and comparison to heat/cold modalities for pain control. Pt verbalized understanding.  OT reiterated importance of completing HEP. Pt verbalized understanding.  TherAct Hand gripper (level 1) picking up 1-inch blocks - 12 blocks, 2 sets - to improve affected UE grip strengthening, gross motor coordination, endurance of affected UE.   Placing large pegs on pegboard and Using tongs to remove pegs - to improve FM coordinatoin, dexterity, strengthening and endurance of affected UE.  Wrist prayer stretch intermittently between exercises - to improve ROM and manage pain symptoms.  PATIENT EDUCATION: Education details: see today's tx above Person educated: Patient Education method: Explanation, Demonstration, and Handouts Education comprehension: verbalized understanding and needs further education  HOME EXERCISE PROGRAM: 06/02/23 - AROM (wrist flexion/extension, supination/pronation) see pt instructions 06/10/23 - yellow theraputty, Access Code: [email or web address]. Wrist prayer stretch AAROM (see pt instructions)  GOALS: Goals reviewed with patient? Yes  SHORT TERM GOALS: Target date: 06/26/23  Pt will be independent in AROM, PROM, and strengthening HEP with use of handouts. Baseline: new to OPOT Goal status: in progress  2.  Pt will be independent with splint wear and care and report understanding of progressive decrease in wear time of orthosis and/or times when she may continue to benefit from wear of splint. Baseline: wrist orthosis provided by MD Goal status: in progress  3.  Pt will verbalize understanding of improved  sleep positioning to decrease onset of pain and tingling as well as reduction of edema. Baseline: pain, tingling, and swelling after sleep Goal status: in progress  4.  Pt will verbalize understanding of use of modalities to decrease inflammation/pain.  Baseline: new to OPOT Goal status: INITIAL  5.  Pt will verbalize understanding of task modifications and/or potential AE needs to increase ease, safety, and independence with IADLs.  Baseline: pain with cooking, chopping foods Goal status: in progress   LONG TERM GOALS: Target date: 07/17/23  Pt will demonstrate improved UE functional use for ADLs as evidenced by increasing box/ blocks score by 5 blocks with LUE. Baseline: TBD Goal status: INITIAL  2.  Pt will improve A/ROM in left wrist for flexion and extension to at least 90% as compared to right, to have functional motion for tasks like reach and grasp.  Baseline: LUE ROM at 50% or less of RUE Goal status: INITIAL  3.  Pt will demonstrate improved grip strength to >30 #  with LUE to demonstrate functional grasp. Baseline: TBD Goal status: INITIAL  4.  Pt will report improved functional use of LUE as evidenced by decreased score on QuickDASH by 25% Baseline: 54.5% impairment Goal status: INITIAL  5.  Pt will decrease pain at worst from 5/10 to 1/10 or less during grasp and release and power grip in preparation for being able to play pain free golf with left hand and occupational participation in daily roles. Baseline:  Goal status: INITIAL   ASSESSMENT:  CLINICAL IMPRESSION: Based on pt's comments, pt appears eager to return to golf. OT educated pt on protocol, precautions, advised to avoid playing golf until cleared by doctor, considerations and risks when playing golf if return too soon. Pt verbalized understanding though OT uncertain of compliance.  Pt tolerated tasks well though benefited from frequent v/c and education regarding importance of only completing reps as  prescribed and attending to pain threshold d/t distractibility. Pt with reports of not completing HEP, therefore reiterated importance of exercises and pt verbalized understanding. OT to monitor to ensure carryover of education and monitor pt's pain threshold with graded return to functional tasks.  Pt will continue to benefit from skilled occupational therapy services to address strength and coordination, ROM, pain management, GM/FM control, safety awareness, introduction of compensatory strategies/AE prn, and implementation of an HEP to improve participation and safety during ADLs, IADLs, and leisure pursuits.    PERFORMANCE DEFICITS: in functional skills including ADLs, IADLs, coordination, edema, ROM, strength, pain, flexibility, Gross motor control, body mechanics, decreased knowledge of precautions, and UE functional use and psychosocial skills including environmental adaptation and routines and behaviors.  PLAN:  OT FREQUENCY: 2x/week  OT DURATION: 6 weeks  PLANNED INTERVENTIONS: 97168 OT Re-evaluation, 97535 self care/ADL training, 40981 therapeutic exercise, 97530 therapeutic activity, 97112 neuromuscular re-education, 97140 manual therapy, 97035 ultrasound, 97018 paraffin, 19147 moist heat, 97010 cryotherapy, 97760 Orthotics management and training, 82956 Splinting (initial encounter), [id number] Subsequent splinting/medication, passive range of motion, compression bandaging, coping strategies training, patient/family education, and DME and/or AE instructions  RECOMMENDED OTHER SERVICES: NA  CONSULTED AND AGREED WITH PLAN OF CARE: Patient  PLAN FOR NEXT SESSION: Review AROM/AAROM and theraputty, continue to progress AAROM/PROM exercises within pain threshold, okay to begin hand exerciser  Per pt request, try paraffin wax     Oakley Bellman, OTR/L 06/18/2023, 8:02 AM  [facility] at [facility] [address], [address] [place], [place] [address] Phone  # [phone] Fax # [phone]

## 2023-06-22 ENCOUNTER — Ambulatory Visit: Admitting: Occupational Therapy

## 2023-06-22 DIAGNOSIS — M6281 Muscle weakness (generalized): Secondary | ICD-10-CM

## 2023-06-22 DIAGNOSIS — R278 Other lack of coordination: Secondary | ICD-10-CM

## 2023-06-22 DIAGNOSIS — M25532 Pain in left wrist: Secondary | ICD-10-CM | POA: Diagnosis not present

## 2023-06-22 DIAGNOSIS — M25632 Stiffness of left wrist, not elsewhere classified: Secondary | ICD-10-CM

## 2023-06-22 NOTE — Therapy (Signed)
 OUTPATIENT OCCUPATIONAL THERAPY ORTHO Treatment  Patient Name: Cynthia Pace MRN: [id number] DOB:[birth year], 63 y.o., female Today's Date: 06/22/2023  PCP: Audria Leather, MD REFERRING PROVIDER: Wes Hamman, MD  END OF SESSION:  OT End of Session - 06/22/23 1108     Visit Number 5    Number of Visits 13    Date for OT Re-Evaluation 07/17/23    Authorization Type [employer] 2025    OT Start Time 1105    OT Stop Time 1145    OT Time Calculation (min) 40 min    Activity Tolerance Patient tolerated treatment well    Behavior During Therapy WFL for tasks assessed/performed                 Past Medical History:  Diagnosis Date   Arthritis    "left ankle" (05/04/2015)   Asthma    Carpal tunnel syndrome    Chronic bronchitis (HCC)    Chronic lower back pain    Elevated liver enzymes    Environmental allergies    Family history of adverse reaction to anesthesia    "daughter died after 2nd epidural for C-section; they were able to bring her back"   GERD (gastroesophageal reflux disease)    History of kidney stones    History of TMJ syndrome    Hyperlipidemia    Hypertension    Kidney stones    Restless leg syndrome    Seasonal allergies    Past Surgical History:  Procedure Laterality Date   ABDOMINAL HYSTERECTOMY     ANKLE SURGERY Left    severed artery and tendons   APPENDECTOMY     CYSTOSCOPY W/ STONE MANIPULATION  X 1   FRACTURE SURGERY     JOINT REPLACEMENT     ORIF HUMERUS FRACTURE Left 05/03/2015   Procedure: OPEN REDUCTION INTERNAL FIXATION (ORIF) LEFT PROXIMAL HUMERUS FRACTURE NONUNION;  Surgeon: Jasmine Mesi, MD;  Location: [facility] OR;  Service: Orthopedics;  Laterality: Left;   TONSILLECTOMY     TOTAL HIP ARTHROPLASTY Left 12/29/2017   TOTAL HIP ARTHROPLASTY Left 12/29/2017   Procedure: LEFT TOTAL HIP ARTHROPLASTY ANTERIOR APPROACH;  Surgeon: Jasmine Mesi, MD;  Location: [facility] OR;  Service: Orthopedics;  Laterality: Left;   Patient  Active Problem List   Diagnosis Date Noted   Closed fracture of left distal radius 04/22/2023   Avascular necrosis of bone of left hip (HCC)    Hip arthritis 12/29/2017   Lumbar pain 06/08/2017   Shoulder fracture, left 05/03/2015    ONSET DATE: fx 04/14/23 (referral date 05/28/23)  REFERRING DIAG: W09.811B (ICD-10-CM) - Other closed intra-articular fracture of distal end of left radius, initial encounter M25.532 (ICD-10-CM) - Pain in left wrist  THERAPY DIAG:  Pain in left wrist  Stiffness of left wrist, not elsewhere classified  Muscle weakness (generalized)  Other lack of coordination  Rationale for Evaluation and Treatment: Rehabilitation  SUBJECTIVE:   SUBJECTIVE STATEMENT: Pt reports no longer wearing the wrist brace at all, however will wear it if she were to go out to [place] or somewhere busy.    Pt reports no pain in hand this morning, until going to close the door.   Pt accompanied by: self  PERTINENT HISTORY: 63 year old female who presented to ED 04/14/23 with left wrist pain.  Patient reports that she tripped and fell catching herself on her left wrist.  She did have some alcohol tonight.  X-rays reviewed and show distal radius fracture with an ulnar  styloid fracture.  Patient was placed in a sugar-tong splint and given a sling.  Cast removed by Dr. Christiane [name] 05/28/23 and prefabricated wrist orthosis donned.  PRECAUTIONS: None  RED FLAGS: None   WEIGHT BEARING RESTRICTIONS: No  PAIN:  Are you having pain? Yes: NPRS scale: 1-2/10 Pain location: radial and ulnar side of wrist Pain description: dull Aggravating factors: using it Relieving factors: Advil  FALLS: Has patient fallen in last 6 months? Yes. 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  "This was the first and only fall this year".  LIVING ENVIRONMENT: Lives with: lives with their spouse Lives  in: House/apartment Stairs: Yes: Internal: full flights of steps; and External: threshold steps Has following equipment at home: Walker - 2 wheeled - not currently using it  PLOF: Independent, Independent with basic ADLs, Independent with community mobility without device, Independent with homemaking with ambulation, and Leisure: playing golf  PATIENT GOALS: to be able to swing a club, decreased pain with household chores  NEXT MD VISIT: 07/09/23  OBJECTIVE:  Note: Objective measures were completed at Evaluation unless otherwise noted.  HAND DOMINANCE: Right  ADLs: Overall ADLs: difficulty opening pill bottles, difficulty with getting dressed due to decreased grasp and pain  Grooming: decreased strength to push toothpaste Upper body dressing: clothing fasteners Lower body dressing: mild difficulty and pain with pulling up pants  FUNCTIONAL OUTCOME MEASURES: Quick Dash: 54.5%   UPPER EXTREMITY ROM:     Active ROM Right eval Left eval  Shoulder flexion    Shoulder abduction    Shoulder adduction    Shoulder extension    Shoulder internal rotation    Shoulder external rotation    Elbow flexion    Elbow extension    Wrist flexion  80-90 60 12  Wrist extension  60-80 55 28  Wrist ulnar deviation  30-45 42 18  Wrist radial deviation 15-20 15 8   Wrist pronation WFL Pain  Wrist supination WFL 80% with increased pain  (Blank rows = not tested)  HAND FUNCTION: TBD - loose fist on L  COORDINATION: TBD  SENSATION: WFL  EDEMA: mild swelling in dorsal and volar aspect of L wrist  COGNITION: Overall cognitive status: Within functional limits for tasks assessed   TREATMENT DATE:  06/22/23 Modalities: educated on use of paraffin wax.  Encouraged attempts at trial size purchase as pt unsure if she still has home setup.  Pt provided with recommendations for purchase and reporting understanding. PROM: engaged in wrist flexion and wrist extension x5 with opposite UE to  facilitate increased ROM and sustained hold.  OT instructing pt to hold position for 15 seconds each rep.  OT providing intermittent demonstration and cues for improved carryover. OT reiterated engaging in AROM and PROM 4x/day with focus on increased ROM as tolerated.  Strengthening: engaged in forearm supination/pronation, wrist flexion, and wrist extension with 1# dumbbell.  Pt completing 8 reps of each with good technique and no pain with supination/pronation, however reports mild increase in pain with wrist extension.  OT instructed pt to attempt 1x/day but if pain increases and does not subside to terminate tasks with weight.   PATIENT EDUCATION: Education details: see today's tx above Person educated: Patient Education method: Explanation, Demonstration, and Handouts Education comprehension: verbalized understanding and needs further education  HOME EXERCISE PROGRAM: 06/02/23 - AROM (wrist flexion/extension, supination/pronation) see pt instructions 06/10/23 -  yellow theraputty, Access Code: [email or web address]. Wrist prayer stretch AAROM (see pt instructions)  GOALS: Goals reviewed with patient? Yes  SHORT TERM GOALS: Target date: 06/26/23  Pt will be independent in AROM, PROM, and strengthening HEP with use of handouts. Baseline: new to OPOT Goal status: in progress  2.  Pt will be independent with splint wear and care and report understanding of progressive decrease in wear time of orthosis and/or times when she may continue to benefit from wear of splint. Baseline: wrist orthosis provided by MD Goal status: in progress  3.  Pt will verbalize understanding of improved sleep positioning to decrease onset of pain and tingling as well as reduction of edema. Baseline: pain, tingling, and swelling after sleep Goal status: in progress  4.  Pt will verbalize understanding of use of modalities to decrease inflammation/pain.  Baseline: new to OPOT Goal status: INITIAL  5.  Pt will verbalize  understanding of task modifications and/or potential AE needs to increase ease, safety, and independence with IADLs.  Baseline: pain with cooking, chopping foods Goal status: in progress   LONG TERM GOALS: Target date: 07/17/23  Pt will demonstrate improved UE functional use for ADLs as evidenced by increasing box/ blocks score by 5 blocks with LUE. Baseline: TBD Goal status: INITIAL  2.  Pt will improve A/ROM in left wrist for flexion and extension to at least 90% as compared to right, to have functional motion for tasks like reach and grasp.  Baseline: LUE ROM at 50% or less of RUE Goal status: INITIAL  3.  Pt will demonstrate improved grip strength to >30 #  with LUE to demonstrate functional grasp. Baseline: TBD Goal status: INITIAL  4.  Pt will report improved functional use of LUE as evidenced by decreased score on QuickDASH by 25% Baseline: 54.5% impairment Goal status: INITIAL  5.  Pt will decrease pain at worst from 5/10 to 1/10 or less during grasp and release and power grip in preparation for being able to play pain free golf with left hand and occupational participation in daily roles. Baseline:  Goal status: INITIAL   ASSESSMENT:  CLINICAL IMPRESSION: Based on pt's comments, pt appears eager to return to golf. OT re-educated pt on protocol, precautions, advised to avoid playing golf until cleared by doctor, considerations and risks when playing golf if return too soon. Pt continues to verbalize understanding, however stating she could chip and putt.  Pt continues to benefit from frequent verbal cues and education regarding importance of only completing reps as prescribed and attending to pain threshold d/t distractibility. Pt still with decreased completion of HEP, therefore reiterated importance of exercises and pt verbalized understanding. OT to monitor to ensure carryover of education and monitor pt's pain threshold with graded return to functional tasks.  Pt reports  mild pain increase with attempts at increased work load with 1# dumbbell, therefore reiterated importance of AROM and PROM to progress towards strengthening and eventual return to leisure task - golf.  Pt will continue to benefit from skilled occupational therapy services to address strength and coordination, ROM, pain management, GM/FM control, safety awareness, introduction of compensatory strategies/AE prn, and implementation of an HEP to improve participation and safety during ADLs, IADLs, and leisure pursuits.    PERFORMANCE DEFICITS: in functional skills including ADLs, IADLs, coordination, edema, ROM, strength, pain, flexibility, Gross motor control, body mechanics, decreased knowledge of precautions, and UE functional use and psychosocial skills including environmental adaptation and routines and behaviors.  PLAN:  OT FREQUENCY: 2x/week  OT DURATION: 6 weeks  PLANNED INTERVENTIONS: 97168 OT Re-evaluation, 97535 self care/ADL training, 36644 therapeutic exercise, 97530 therapeutic activity, 97112 neuromuscular re-education, 97140 manual therapy, 97035 ultrasound, 97018 paraffin, 03474 moist heat, 97010 cryotherapy, 97760 Orthotics management and training, 25956 Splinting (initial encounter), [id number] Subsequent splinting/medication, passive range of motion, compression bandaging, coping strategies training, patient/family education, and DME and/or AE instructions  RECOMMENDED OTHER SERVICES: NA  CONSULTED AND AGREED WITH PLAN OF CARE: Patient  PLAN FOR NEXT SESSION: Review AROM/AAROM and theraputty, continue to progress AAROM/PROM exercises within pain threshold, okay to begin hand exerciser    Anthonette Kinsman, OTR/L 06/22/2023, 3:43 PM  [facility] at [facility] [address], [address] [place], [place] [address] Phone # [phone] Fax # [phone]

## 2023-06-24 ENCOUNTER — Ambulatory Visit: Admitting: Occupational Therapy

## 2023-06-24 DIAGNOSIS — M25632 Stiffness of left wrist, not elsewhere classified: Secondary | ICD-10-CM

## 2023-06-24 DIAGNOSIS — M25532 Pain in left wrist: Secondary | ICD-10-CM | POA: Diagnosis not present

## 2023-06-24 DIAGNOSIS — M6281 Muscle weakness (generalized): Secondary | ICD-10-CM

## 2023-06-24 DIAGNOSIS — R278 Other lack of coordination: Secondary | ICD-10-CM

## 2023-06-24 NOTE — Therapy (Signed)
 OUTPATIENT OCCUPATIONAL THERAPY ORTHO Treatment  Patient Name: THEOLA GACHUPIN MRN: [id number] DOB:17-Nov-1960, 63 y.o., female Today's Date: 06/24/2023  PCP: Audria Leather, MD REFERRING PROVIDER: Wes Hamman, MD  END OF SESSION:  OT End of Session - 06/24/23 1259     Visit Number 6    Number of Visits 13    Date for OT Re-Evaluation 07/17/23    Authorization Type [employer] 2025    OT Start Time 1108    OT Stop Time 1146    OT Time Calculation (min) 38 min    Activity Tolerance Patient tolerated treatment well    Behavior During Therapy WFL for tasks assessed/performed                 Past Medical History:  Diagnosis Date   Arthritis    "left ankle" (05/04/2015)   Asthma    Carpal tunnel syndrome    Chronic bronchitis (HCC)    Chronic lower back pain    Elevated liver enzymes    Environmental allergies    Family history of adverse reaction to anesthesia    "daughter died after 2nd epidural for C-section; they were able to bring her back"   GERD (gastroesophageal reflux disease)    History of kidney stones    History of TMJ syndrome    Hyperlipidemia    Hypertension    Kidney stones    Restless leg syndrome    Seasonal allergies    Past Surgical History:  Procedure Laterality Date   ABDOMINAL HYSTERECTOMY     ANKLE SURGERY Left    severed artery and tendons   APPENDECTOMY     CYSTOSCOPY W/ STONE MANIPULATION  X 1   FRACTURE SURGERY     JOINT REPLACEMENT     ORIF HUMERUS FRACTURE Left 05/03/2015   Procedure: OPEN REDUCTION INTERNAL FIXATION (ORIF) LEFT PROXIMAL HUMERUS FRACTURE NONUNION;  Surgeon: Jasmine Mesi, MD;  Location: [facility] OR;  Service: Orthopedics;  Laterality: Left;   TONSILLECTOMY     TOTAL HIP ARTHROPLASTY Left 12/29/2017   TOTAL HIP ARTHROPLASTY Left 12/29/2017   Procedure: LEFT TOTAL HIP ARTHROPLASTY ANTERIOR APPROACH;  Surgeon: Jasmine Mesi, MD;  Location: [facility] OR;  Service: Orthopedics;  Laterality: Left;   Patient  Active Problem List   Diagnosis Date Noted   Closed fracture of left distal radius 04/22/2023   Avascular necrosis of bone of left hip (HCC)    Hip arthritis 12/29/2017   Lumbar pain 06/08/2017   Shoulder fracture, left 05/03/2015    ONSET DATE: fx 04/14/23 (referral date 05/28/23)  REFERRING DIAG: Q65.784O (ICD-10-CM) - Other closed intra-articular fracture of distal end of left radius, initial encounter M25.532 (ICD-10-CM) - Pain in left wrist  THERAPY DIAG:  Pain in left wrist  Stiffness of left wrist, not elsewhere classified  Muscle weakness (generalized)  Other lack of coordination  Rationale for Evaluation and Treatment: Rehabilitation  SUBJECTIVE:   SUBJECTIVE STATEMENT: Pt reported using affected hand yesterday "a lot" out in community. Pt reported possibly will attempt some putting for golf later today. Pt reported has ordered Paraffin wax for home use though has not yet set it up. Pt reported generally no longer wearing brace but sometimes wears it out in community PRN.   Pt accompanied by: self  PERTINENT HISTORY: 63 year old female who presented to ED 04/14/23 with left wrist pain.  Patient reports that she tripped and fell catching herself on her left wrist.  She did have some alcohol tonight.  X-rays reviewed and show distal radius fracture with an ulnar styloid fracture.  Patient was placed in a sugar-tong splint and given a sling.  Cast removed by Dr. Christiane [name] 05/28/23 and prefabricated wrist orthosis donned.  PRECAUTIONS: None  RED FLAGS: None   WEIGHT BEARING RESTRICTIONS: No  PAIN:  Are you having pain? Yes: NPRS scale: 2-3/10 Pain location: radial and ulnar side of wrist Pain description: dull Aggravating factors: using it Relieving factors: Advil  FALLS: Has patient fallen in last 6 months? Yes. 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  "This was  the first and only fall this year".  LIVING ENVIRONMENT: Lives with: lives with their spouse Lives in: House/apartment Stairs: Yes: Internal: full flights of steps; and External: threshold steps Has following equipment at home: Walker - 2 wheeled - not currently using it  PLOF: Independent, Independent with basic ADLs, Independent with community mobility without device, Independent with homemaking with ambulation, and Leisure: playing golf  PATIENT GOALS: to be able to swing a club, decreased pain with household chores  NEXT MD VISIT: 07/09/23  OBJECTIVE:  Note: Objective measures were completed at Evaluation unless otherwise noted.  HAND DOMINANCE: Right  ADLs: Overall ADLs: difficulty opening pill bottles, difficulty with getting dressed due to decreased grasp and pain  Grooming: decreased strength to push toothpaste Upper body dressing: clothing fasteners Lower body dressing: mild difficulty and pain with pulling up pants  FUNCTIONAL OUTCOME MEASURES: Quick Dash: 54.5%   UPPER EXTREMITY ROM:     Active ROM Right eval Left eval  Shoulder flexion    Shoulder abduction    Shoulder adduction    Shoulder extension    Shoulder internal rotation    Shoulder external rotation    Elbow flexion    Elbow extension    Wrist flexion  80-90 60 12  Wrist extension  60-80 55 28  Wrist ulnar deviation  30-45 42 18  Wrist radial deviation 15-20 15 8   Wrist pronation WFL Pain  Wrist supination WFL 80% with increased pain  (Blank rows = not tested)  HAND FUNCTION: TBD - loose fist on L  COORDINATION: TBD  SENSATION: WFL  EDEMA: mild swelling in dorsal and volar aspect of L wrist  COGNITION: Overall cognitive status: Within functional limits for tasks assessed   TREATMENT DATE:   Self Care OT educated pt on pain management strategies e.g. paraffin wax option for home use, use of ice modality after therapy and after exercises, purpose of ice vs heat modality.  Pt  verbalized understanding.   TherAct Placing small pegs on pegboard with affected hand - to improve affected UE in-hand manipulation, for FM coordination and dexterity.   Placing/removing resistive clips on vertical surface with affected hand, 3-finger pinch (yellow, red, green, blue, black) - to improve strengthening and coordination of affected UE.  OT assessed pt's progress towards goals, see below for updates.   Review of wrist flex HEP with dumbbell (wrist flex) - to improve affected UE ROM and strengthening. Pt returned demo.  HEP update: PROM wrist flex (hold 20 seconds, 3 reps, 3x per day) - to improve affected UE ROM. V/c and handwritten instructions: Avoid sharp increase in pain. Use Right hand to gently move left wrist forward by pushing at back of Left hand (not fingers). Pt returned demo with min v/c.     PATIENT EDUCATION: Education  details: see today's tx above Person educated: Patient Education method: Explanation, Demonstration, and Handouts Education comprehension: verbalized understanding and needs further education  HOME EXERCISE PROGRAM: 06/02/23 - AROM (wrist flexion/extension, supination/pronation) see pt instructions 06/10/23 - yellow theraputty, Access Code: [email or web address]. Wrist prayer stretch AAROM (see pt instructions) 06/24/23 - wrist PROM flex, Access Code: [email or web address] (same as above)  GOALS: Goals reviewed with patient? Yes  SHORT TERM GOALS: Target date: 06/26/23  Pt will be independent in AROM, PROM, and strengthening HEP with use of handouts. Baseline: new to OPOT Goal status: in progress  2.  Pt will be independent with splint wear and care and report understanding of progressive decrease in wear time of orthosis and/or times when she may continue to benefit from wear of splint. Baseline: wrist orthosis provided by MD 06/24/23 - Pt no longer wearing splint except sometimes for community activities. Pt verbalized understanding of weaning from splint wear  schedule. Goal status: MET  3.  Pt will verbalize understanding of improved sleep positioning to decrease onset of pain and tingling as well as reduction of edema. Baseline: pain, tingling, and swelling after sleep 06/24/23 - Pt reported pain, tingling symptoms have improved. Pt ind recalled using pillows as supports to improve sleep positioning.  Goal status: MET  4.  Pt will verbalize understanding of use of modalities to decrease inflammation/pain.  Baseline: new to OPOT 06/24/23 - Pt reported using heat pack and paraffin wax to relax muscles and to manage pain.  Pt ind recalled using ice to reduce swelling though not using ice as often as heat. Goal status: MET  5.  Pt will verbalize understanding of task modifications and/or potential AE needs to increase ease, safety, and independence with IADLs.  Baseline: pain with cooking, chopping foods 06/24/23 - Pt reported return to cooking tasks. Pt reported sometimes "favoring" L hand and not always using as much strength "as I should be." Pt reported chopping foods "fine, a lot better." Goal status: in progress   LONG TERM GOALS: Target date: 07/17/23  Pt will demonstrate improved UE functional use for ADLs as evidenced by increasing box/ blocks score by 5 blocks with LUE. Baseline:  06/24/23 - RUE: 52 blocks, LUE: 51 blocks Goal status: MET  2.  Pt will improve A/ROM in left wrist for flexion and extension to at least 90% as compared to right, to have functional motion for tasks like reach and grasp.  Baseline: LUE ROM at 50% or less of RUE 06/24/23 - Continued stiffness of LUE compared to RUE. LUE impaired wrist flex. LUE wrist ext [facility]. Goal status: in progress  3.  Pt will demonstrate improved grip strength to >30 #  with LUE to demonstrate functional grasp. Baseline: TBD Goal status: in progress  4.  Pt will report improved functional use of LUE as evidenced by decreased score on QuickDASH by 25% Baseline: 54.5% impairment Goal  status: in progress  5.  Pt will decrease pain at worst from 5/10 to 1/10 or less during grasp and release and power grip in preparation for being able to play pain free golf with left hand and occupational participation in daily roles. Baseline:  Goal status: in progress   ASSESSMENT:  CLINICAL IMPRESSION: Pt met 3 out of 5 STG and 1 LTG today, demo'ing good progress towards goals. Pt tolerated tasks well. Pt reported mild increase in pain and fatigue of affected UE after all tasks. OT recommended to pt to use ice modality at home after session and after  completion of daily HEP to manage pain. Pt verbalized understanding. Pt will continue to benefit from skilled occupational therapy services to address strength and coordination, ROM, pain management, GM/FM control, safety awareness, introduction of compensatory strategies/AE prn, and implementation of an HEP to improve participation and safety during ADLs, IADLs, and leisure pursuits.    PERFORMANCE DEFICITS: in functional skills including ADLs, IADLs, coordination, edema, ROM, strength, pain, flexibility, Gross motor control, body mechanics, decreased knowledge of precautions, and UE functional use and psychosocial skills including environmental adaptation and routines and behaviors.      PLAN:  OT FREQUENCY: 2x/week  OT DURATION: 6 weeks  PLANNED INTERVENTIONS: 97168 OT Re-evaluation, 97535 self care/ADL training, 16109 therapeutic exercise, 97530 therapeutic activity, 97112 neuromuscular re-education, 97140 manual therapy, 97035 ultrasound, 97018 paraffin, 60454 moist heat, 97010 cryotherapy, 97760 Orthotics management and training, 09811 Splinting (initial encounter), [phone] Subsequent splinting/medication, passive range of motion, compression bandaging, coping strategies training, patient/family education, and DME and/or AE instructions  RECOMMENDED OTHER SERVICES: NA  CONSULTED AND AGREED WITH PLAN OF CARE: Patient  PLAN FOR NEXT  SESSION: Review AROM/AAROM and theraputty, continue to progress AAROM/PROM exercises within pain threshold, okay to begin hand exerciser, Continue to progress strengthening and ROM as tolerated, assess grip strength and QuickDASH and pain levels (LTG 3-5)     Oakley Bellman, OTR/L 06/24/2023, 1:06 PM  [facility] at [facility] [address] [place], [place] [address] Phone # [phone] Fax # [phone]

## 2023-06-29 ENCOUNTER — Ambulatory Visit: Attending: Orthopaedic Surgery | Admitting: Occupational Therapy

## 2023-06-29 DIAGNOSIS — M25532 Pain in left wrist: Secondary | ICD-10-CM | POA: Diagnosis present

## 2023-06-29 DIAGNOSIS — R278 Other lack of coordination: Secondary | ICD-10-CM | POA: Insufficient documentation

## 2023-06-29 DIAGNOSIS — M25632 Stiffness of left wrist, not elsewhere classified: Secondary | ICD-10-CM | POA: Insufficient documentation

## 2023-06-29 DIAGNOSIS — M6281 Muscle weakness (generalized): Secondary | ICD-10-CM | POA: Diagnosis present

## 2023-06-29 NOTE — Therapy (Signed)
 OUTPATIENT OCCUPATIONAL THERAPY ORTHO Treatment  Patient Name: Cynthia Pace MRN: [id number] DOB:16-Mar-1960, 63 y.o., female Today's Date: 06/29/2023  PCP: Audria Leather, MD REFERRING PROVIDER: Wes Hamman, MD  END OF SESSION:  OT End of Session - 06/29/23 1208     Visit Number 7    Number of Visits 13    Date for OT Re-Evaluation 07/17/23    Authorization Type [employer] 2025    OT Start Time 1106    OT Stop Time 1146    OT Time Calculation (min) 40 min    Activity Tolerance Patient tolerated treatment well    Behavior During Therapy WFL for tasks assessed/performed                  Past Medical History:  Diagnosis Date   Arthritis    "left ankle" (05/04/2015)   Asthma    Carpal tunnel syndrome    Chronic bronchitis (HCC)    Chronic lower back pain    Elevated liver enzymes    Environmental allergies    Family history of adverse reaction to anesthesia    "daughter died after 2nd epidural for C-section; they were able to bring her back"   GERD (gastroesophageal reflux disease)    History of kidney stones    History of TMJ syndrome    Hyperlipidemia    Hypertension    Kidney stones    Restless leg syndrome    Seasonal allergies    Past Surgical History:  Procedure Laterality Date   ABDOMINAL HYSTERECTOMY     ANKLE SURGERY Left    severed artery and tendons   APPENDECTOMY     CYSTOSCOPY W/ STONE MANIPULATION  X 1   FRACTURE SURGERY     JOINT REPLACEMENT     ORIF HUMERUS FRACTURE Left 05/03/2015   Procedure: OPEN REDUCTION INTERNAL FIXATION (ORIF) LEFT PROXIMAL HUMERUS FRACTURE NONUNION;  Surgeon: Jasmine Mesi, MD;  Location: [facility] OR;  Service: Orthopedics;  Laterality: Left;   TONSILLECTOMY     TOTAL HIP ARTHROPLASTY Left 12/29/2017   TOTAL HIP ARTHROPLASTY Left 12/29/2017   Procedure: LEFT TOTAL HIP ARTHROPLASTY ANTERIOR APPROACH;  Surgeon: Jasmine Mesi, MD;  Location: [facility] OR;  Service: Orthopedics;  Laterality: Left;   Patient  Active Problem List   Diagnosis Date Noted   Closed fracture of left distal radius 04/22/2023   Avascular necrosis of bone of left hip (HCC)    Hip arthritis 12/29/2017   Lumbar pain 06/08/2017   Shoulder fracture, left 05/03/2015    ONSET DATE: fx 04/14/23 (referral date 05/28/23)  REFERRING DIAG: N56.213Y (ICD-10-CM) - Other closed intra-articular fracture of distal end of left radius, initial encounter M25.532 (ICD-10-CM) - Pain in left wrist  THERAPY DIAG:  Pain in left wrist  Stiffness of left wrist, not elsewhere classified  Muscle weakness (generalized)  Rationale for Evaluation and Treatment: Rehabilitation  SUBJECTIVE:   SUBJECTIVE STATEMENT: Pt reports purchasing the Paraffin wax kit, but has not yet attempted it. Pt reports putting 18 holes, noting pain around hole 16 but able to complete without pain >2/10.  Pt accompanied by: self  PERTINENT HISTORY: 63 year old female who presented to ED 04/14/23 with left wrist pain.  Patient reports that she tripped and fell catching herself on her left wrist.  She did have some alcohol tonight.  X-rays reviewed and show distal radius fracture with an ulnar styloid fracture.  Patient was placed in a sugar-tong splint and given a sling.  Cast removed  by Dr. Christiane [name] 05/28/23 and prefabricated wrist orthosis donned.  PRECAUTIONS: None  RED FLAGS: None   WEIGHT BEARING RESTRICTIONS: No  PAIN:  Are you having pain? Yes: NPRS scale: 1/10 Pain location: radial and ulnar side of wrist Pain description: dull Aggravating factors: using it Relieving factors: Advil  FALLS: Has patient fallen in last 6 months? Yes. 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  "This was the first and only fall this year".  LIVING ENVIRONMENT: Lives with: lives with their spouse Lives in: House/apartment Stairs: Yes: Internal: full flights of steps;  and External: threshold steps Has following equipment at home: Walker - 2 wheeled - not currently using it  PLOF: Independent, Independent with basic ADLs, Independent with community mobility without device, Independent with homemaking with ambulation, and Leisure: playing golf  PATIENT GOALS: to be able to swing a club, decreased pain with household chores  NEXT MD VISIT: 07/09/23  OBJECTIVE:  Note: Objective measures were completed at Evaluation unless otherwise noted.  HAND DOMINANCE: Right  ADLs: Overall ADLs: difficulty opening pill bottles, difficulty with getting dressed due to decreased grasp and pain  Grooming: decreased strength to push toothpaste Upper body dressing: clothing fasteners Lower body dressing: mild difficulty and pain with pulling up pants  FUNCTIONAL OUTCOME MEASURES: Quick Dash: 54.5%   QuickDASH: 34.1%   UPPER EXTREMITY ROM:     Active ROM Right eval Left eval  Shoulder flexion    Shoulder abduction    Shoulder adduction    Shoulder extension    Shoulder internal rotation    Shoulder external rotation    Elbow flexion    Elbow extension    Wrist flexion  80-90 60 12  Wrist extension  60-80 55 28  Wrist ulnar deviation  30-45 42 18  Wrist radial deviation 15-20 15 8   Wrist pronation WFL Pain  Wrist supination WFL 80% with increased pain  (Blank rows = not tested)  HAND FUNCTION: TBD - loose fist on L  COORDINATION: TBD  SENSATION: WFL  EDEMA: mild swelling in dorsal and volar aspect of L wrist  COGNITION: Overall cognitive status: Within functional limits for tasks assessed   TREATMENT DATE:  06/29/23 Quick DASH: See above for results.  Pt continues to report moderate pain and moderate tingling as well as moderate difficulty with heavy household tasks and daily routine.  Sleep: OT reiterating education on sleep positioning as pt reports increased tingling bilaterally after sleep Grip strength: L: 10, 15, and 13 (average 12.67#)  lbs and R: 28# Strengthening: engaged in forearm supination/pronation, wrist flexion, and wrist extension with 1# dumbbell. Pt completing 12 reps of each with good technique and no pain with supination/pronation, however reports mild increase in pain with wrist flexion and wrist extension.  OT instructed pt to continue to engage in this exercise as home as pt with decreased pain overall and increased tolerance to routine tasks. PROM: engaged in wrist flexion and wrist extension x5 with opposite UE to facilitate increased ROM and sustained hold.  OT instructing pt to hold position for 15 seconds each rep.  OT providing intermittent demonstration and cues for improved carryover, including hand over hand to facilitate proper positioning of R hand at back of L hand for wrist flexion. OT reiterated engaging in AROM and PROM 4x/day, hold for 20 seconds each with PROM with focus on increased ROM  as tolerated.    PATIENT EDUCATION: Education details: see today's tx above Person educated: Patient Education method: Explanation, Demonstration, and Handouts Education comprehension: verbalized understanding and needs further education  HOME EXERCISE PROGRAM: 06/02/23 - AROM (wrist flexion/extension, supination/pronation) see pt instructions 06/10/23 - yellow theraputty, Access Code: [email or web address]. Wrist prayer stretch AAROM (see pt instructions) 06/24/23 - wrist PROM flex, Access Code: [email or web address] (same as above)  GOALS: Goals reviewed with patient? Yes  SHORT TERM GOALS: Target date: 06/26/23  Pt will be independent in AROM, PROM, and strengthening HEP with use of handouts. Baseline: new to OPOT Goal status: in progress  2.  Pt will be independent with splint wear and care and report understanding of progressive decrease in wear time of orthosis and/or times when she may continue to benefit from wear of splint. Baseline: wrist orthosis provided by MD 06/24/23 - Pt no longer wearing splint except sometimes for  community activities. Pt verbalized understanding of weaning from splint wear schedule. Goal status: MET  3.  Pt will verbalize understanding of improved sleep positioning to decrease onset of pain and tingling as well as reduction of edema. Baseline: pain, tingling, and swelling after sleep 06/24/23 - Pt reported pain, tingling symptoms have improved. Pt ind recalled using pillows as supports to improve sleep positioning.  Goal status: MET  4.  Pt will verbalize understanding of use of modalities to decrease inflammation/pain.  Baseline: new to OPOT 06/24/23 - Pt reported using heat pack and paraffin wax to relax muscles and to manage pain.  Pt ind recalled using ice to reduce swelling though not using ice as often as heat. Goal status: MET  5.  Pt will verbalize understanding of task modifications and/or potential AE needs to increase ease, safety, and independence with IADLs.  Baseline: pain with cooking, chopping foods 06/24/23 - Pt reported return to cooking tasks. Pt reported sometimes "favoring" L hand and not always using as much strength "as I should be." Pt reported chopping foods "fine, a lot better." Goal status: in progress   LONG TERM GOALS: Target date: 07/17/23  Pt will demonstrate improved UE functional use for ADLs as evidenced by increasing box/ blocks score by 5 blocks with LUE. Baseline:  06/24/23 - RUE: 52 blocks, LUE: 51 blocks Goal status: MET  2.  Pt will improve A/ROM in left wrist for flexion and extension to at least 90% as compared to right, to have functional motion for tasks like reach and grasp.  Baseline: LUE ROM at 50% or less of RUE 06/24/23 - Continued stiffness of LUE compared to RUE. LUE impaired wrist flex. LUE wrist ext [facility]. Goal status: in progress  3.  Pt will demonstrate improved grip strength to >30 #  with LUE to demonstrate functional grasp. Baseline: TBD 06/29/23: R: 28# and L: 12.67# Goal status: in progress  4.  Pt will report improved  functional use of LUE as evidenced by decreased score on QuickDASH by 25% Baseline: 54.5% impairment 06/29/23: 34.1% impairment Goal status: in progress  5.  Pt will decrease pain at worst from 5/10 to 1/10 or less during grasp and release and power grip in preparation for being able to play pain free golf with left hand and occupational participation in daily roles. Baseline:  Goal status: in progress   ASSESSMENT:  CLINICAL IMPRESSION: Pt is making progress towards goals reporting improvements on QuickDash by 20% from 54% impairment to 34% impairment. Pt tolerated tasks well, reporting mod pain with grasp assessment. Pt reports still  not having utilized Parrafin wax, but plans to complete today after therapy session.  Pt will continue to benefit from skilled occupational therapy services to address strength and coordination, ROM, pain management, GM/FM control, safety awareness, introduction of compensatory strategies/AE prn, and implementation of an HEP to improve participation and safety during ADLs, IADLs, and leisure pursuits.    PERFORMANCE DEFICITS: in functional skills including ADLs, IADLs, coordination, edema, ROM, strength, pain, flexibility, Gross motor control, body mechanics, decreased knowledge of precautions, and UE functional use and psychosocial skills including environmental adaptation and routines and behaviors.      PLAN:  OT FREQUENCY: 2x/week  OT DURATION: 6 weeks  PLANNED INTERVENTIONS: 97168 OT Re-evaluation, 97535 self care/ADL training, 69629 therapeutic exercise, 97530 therapeutic activity, 97112 neuromuscular re-education, 97140 manual therapy, 97035 ultrasound, 97018 paraffin, 52841 moist heat, 97010 cryotherapy, 97760 Orthotics management and training, 32440 Splinting (initial encounter), [id number] Subsequent splinting/medication, passive range of motion, compression bandaging, coping strategies training, patient/family education, and DME and/or AE  instructions  RECOMMENDED OTHER SERVICES: NA  CONSULTED AND AGREED WITH PLAN OF CARE: Patient  PLAN FOR NEXT SESSION: Review AROM/AAROM and theraputty, continue to progress AAROM/PROM exercises within pain threshold, okay to begin hand exerciser, Continue to progress strengthening and ROM as tolerated, assess  pain levels (LTG 5)     Daleiza Bacchi, OTR/L 06/29/2023, 12:08 PM  [facility] at [facility] [address], [address] [place], [place] [address] Phone # [phone] Fax # [phone]

## 2023-07-01 ENCOUNTER — Ambulatory Visit: Admitting: Occupational Therapy

## 2023-07-01 DIAGNOSIS — M25632 Stiffness of left wrist, not elsewhere classified: Secondary | ICD-10-CM

## 2023-07-01 DIAGNOSIS — M6281 Muscle weakness (generalized): Secondary | ICD-10-CM

## 2023-07-01 DIAGNOSIS — M25532 Pain in left wrist: Secondary | ICD-10-CM | POA: Diagnosis not present

## 2023-07-01 NOTE — Therapy (Signed)
 OUTPATIENT OCCUPATIONAL THERAPY ORTHO Treatment  Patient Name: Cynthia Pace MRN: [id number] DOB:[birth year], 63 y.o., female Today's Date: 07/01/2023  PCP: Audria Leather, MD REFERRING PROVIDER: Wes Hamman, MD  END OF SESSION:  OT End of Session - 07/01/23 1151     Visit Number 8    Number of Visits 13    Date for OT Re-Evaluation 07/17/23    Authorization Type [employer] 2025    OT Start Time 1106    OT Stop Time 1145    OT Time Calculation (min) 39 min    Activity Tolerance Patient tolerated treatment well    Behavior During Therapy WFL for tasks assessed/performed                   Past Medical History:  Diagnosis Date   Arthritis    "left ankle" (05/04/2015)   Asthma    Carpal tunnel syndrome    Chronic bronchitis (HCC)    Chronic lower back pain    Elevated liver enzymes    Environmental allergies    Family history of adverse reaction to anesthesia    "daughter died after 2nd epidural for C-section; they were able to bring her back"   GERD (gastroesophageal reflux disease)    History of kidney stones    History of TMJ syndrome    Hyperlipidemia    Hypertension    Kidney stones    Restless leg syndrome    Seasonal allergies    Past Surgical History:  Procedure Laterality Date   ABDOMINAL HYSTERECTOMY     ANKLE SURGERY Left    severed artery and tendons   APPENDECTOMY     CYSTOSCOPY W/ STONE MANIPULATION  X 1   FRACTURE SURGERY     JOINT REPLACEMENT     ORIF HUMERUS FRACTURE Left 05/03/2015   Procedure: OPEN REDUCTION INTERNAL FIXATION (ORIF) LEFT PROXIMAL HUMERUS FRACTURE NONUNION;  Surgeon: Jasmine Mesi, MD;  Location: [facility] OR;  Service: Orthopedics;  Laterality: Left;   TONSILLECTOMY     TOTAL HIP ARTHROPLASTY Left 12/29/2017   TOTAL HIP ARTHROPLASTY Left 12/29/2017   Procedure: LEFT TOTAL HIP ARTHROPLASTY ANTERIOR APPROACH;  Surgeon: Jasmine Mesi, MD;  Location: [facility] OR;  Service: Orthopedics;  Laterality: Left;   Patient  Active Problem List   Diagnosis Date Noted   Closed fracture of left distal radius 04/22/2023   Avascular necrosis of bone of left hip (HCC)    Hip arthritis 12/29/2017   Lumbar pain 06/08/2017   Shoulder fracture, left 05/03/2015    ONSET DATE: fx 04/14/23 (referral date 05/28/23)  REFERRING DIAG: W09.811B (ICD-10-CM) - Other closed intra-articular fracture of distal end of left radius, initial encounter M25.532 (ICD-10-CM) - Pain in left wrist  THERAPY DIAG:  Pain in left wrist  Stiffness of left wrist, not elsewhere classified  Muscle weakness (generalized)  Rationale for Evaluation and Treatment: Rehabilitation  SUBJECTIVE:   SUBJECTIVE STATEMENT: Pt reported minimal to no pain, only noticeable when "hand is in the wrong place." Pt reported no longer wearing brace in the past 1-2 weeks except 1x in busy community environment. Pt reported exercises going well d/t HEP was simplified at most recent OT session.  Pt accompanied by: self  PERTINENT HISTORY: 63 year old female who presented to ED 04/14/23 with left wrist pain.  Patient reports that she tripped and fell catching herself on her left wrist.  She did have some alcohol tonight.  X-rays reviewed and show distal radius fracture with an ulnar  styloid fracture.  Patient was placed in a sugar-tong splint and given a sling.  Cast removed by Dr. Christiane [name] 05/28/23 and prefabricated wrist orthosis donned.  PRECAUTIONS: None  RED FLAGS: None   WEIGHT BEARING RESTRICTIONS: No  PAIN:  Are you having pain? Yes: NPRS scale: 0-1/10 Pain location: radial and ulnar side of wrist Pain description: dull Aggravating factors: using it Relieving factors: Advil  FALLS: Has patient fallen in last 6 months? Yes. 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  "This was the first and only fall this year".  LIVING ENVIRONMENT: Lives with:  lives with their spouse Lives in: House/apartment Stairs: Yes: Internal: full flights of steps; and External: threshold steps Has following equipment at home: Walker - 2 wheeled - not currently using it  PLOF: Independent, Independent with basic ADLs, Independent with community mobility without device, Independent with homemaking with ambulation, and Leisure: playing golf  PATIENT GOALS: to be able to swing a club, decreased pain with household chores  NEXT MD VISIT: 07/09/23  OBJECTIVE:  Note: Objective measures were completed at Evaluation unless otherwise noted.  HAND DOMINANCE: Right  ADLs: Overall ADLs: difficulty opening pill bottles, difficulty with getting dressed due to decreased grasp and pain  Grooming: decreased strength to push toothpaste Upper body dressing: clothing fasteners Lower body dressing: mild difficulty and pain with pulling up pants  FUNCTIONAL OUTCOME MEASURES: Quick Dash: 54.5%   QuickDASH: 34.1%   UPPER EXTREMITY ROM:     Active ROM Right eval Left eval  Shoulder flexion    Shoulder abduction    Shoulder adduction    Shoulder extension    Shoulder internal rotation    Shoulder external rotation    Elbow flexion    Elbow extension    Wrist flexion  80-90 60 12  Wrist extension  60-80 55 28  Wrist ulnar deviation  30-45 42 18  Wrist radial deviation 15-20 15 8   Wrist pronation WFL Pain  Wrist supination WFL 80% with increased pain  (Blank rows = not tested)  HAND FUNCTION: TBD - loose fist on L  COORDINATION: TBD  SENSATION: WFL  EDEMA: mild swelling in dorsal and volar aspect of L wrist  COGNITION: Overall cognitive status: Within functional limits for tasks assessed   TREATMENT DATE:   TherAct OT assessed pt's progress towards goals, see below for updates. Pt met LTG #5. Pt continuing to demo decreased affected LUE grip strength compared to unaffected RUE though maintained from previous OT session.  Self-Care OT  reviewed sleep positioning and recommended to extend elbows more (e.g. in neutral) during sleep d/t pt reporting significant elbow flex during sleep and numbness/tingling symptoms when waking up. OT educated pt on UE nerve anatomy. Pt acknowledged understanding.  OT recommended to pt to f/u with doctor at upcoming check-in appointment to monitor UE progress. Pt acknowledged understanding and stated next doctor's appointment is scheduled for next week.  OT reviewed purpose of ice modality to decrease soreness and heat modality to decrease pain. Pt verbalized understanding.  TherEx Pt reported difficulty completing theraputty HEP d/t busy schedule.  HEP update: Wringing washcloth/dish towel - 10 reps, 2 sets, variable thickness of towel to challenge grip strength using different grasp patterns - to improve affected UE grip strengthening, for gross motor coordination, to improve carryover of HEP as alternative option to theraputty.  Flex bar (red) -  2 sets, 10 reps - supination, pronation - to improve affected UE gross motor coordination and strengthening.  Digitflex (blue/"light") - 2 sets: 10 reps full composite grasp, 10 reps alternating fingers - to improve affected UE coordination and strengthening.  Wrist flex and ext PROM hold with 2 lb dumbbell (graded down from 3 lb dumbbell) - 60 s hold graded down to 30 s hold for 3 sets each d/t pt reporting discomfort with extended hold. - to improve affected UE ROM. Wrist circumduction AROM between sets to decrease stiffness and discomfort.    PATIENT EDUCATION: Education details: see today's tx above Person educated: Patient Education method: Explanation, Demonstration, and Handouts Education comprehension: verbalized understanding and needs further education  HOME EXERCISE PROGRAM: 06/02/23 - AROM (wrist flexion/extension, supination/pronation) see pt instructions 06/10/23 - yellow theraputty, Access Code: [email or web address]. Wrist prayer stretch AAROM (see  pt instructions) 06/24/23 - wrist PROM flex, Access Code: [email or web address] (same as above) 07/01/23 - Verbal instructions: wringing washcloth/dish towel for grip strength - 10 reps, 2 sets, 2x per day (alternative option to theraputty)  GOALS: Goals reviewed with patient? Yes  SHORT TERM GOALS: Target date: 06/26/23  Pt will be independent in AROM, PROM, and strengthening HEP with use of handouts. Baseline: new to OPOT Goal status: in progress  2.  Pt will be independent with splint wear and care and report understanding of progressive decrease in wear time of orthosis and/or times when she may continue to benefit from wear of splint. Baseline: wrist orthosis provided by MD 06/24/23 - Pt no longer wearing splint except sometimes for community activities. Pt verbalized understanding of weaning from splint wear schedule. Goal status: MET  3.  Pt will verbalize understanding of improved sleep positioning to decrease onset of pain and tingling as well as reduction of edema. Baseline: pain, tingling, and swelling after sleep 06/24/23 - Pt reported pain, tingling symptoms have improved. Pt ind recalled using pillows as supports to improve sleep positioning.  Goal status: MET  4.  Pt will verbalize understanding of use of modalities to decrease inflammation/pain.  Baseline: new to OPOT 06/24/23 - Pt reported using heat pack and paraffin wax to relax muscles and to manage pain.  Pt ind recalled using ice to reduce swelling though not using ice as often as heat. Goal status: MET  5.  Pt will verbalize understanding of task modifications and/or potential AE needs to increase ease, safety, and independence with IADLs.  Baseline: pain with cooking, chopping foods 06/24/23 - Pt reported return to cooking tasks. Pt reported sometimes "favoring" L hand and not always using as much strength "as I should be." Pt reported chopping foods "fine, a lot better." Goal status: in progress   LONG TERM GOALS: Target date:  07/17/23  Pt will demonstrate improved UE functional use for ADLs as evidenced by increasing box/ blocks score by 5 blocks with LUE. Baseline:  06/24/23 - RUE: 52 blocks, LUE: 51 blocks Goal status: MET  2.  Pt will improve A/ROM in left wrist for flexion and extension to at least 90% as compared to right, to have functional motion for tasks like reach and grasp.  Baseline: LUE ROM at 50% or less of RUE 06/24/23 - Continued stiffness of LUE compared to RUE. LUE impaired wrist flex. LUE wrist ext [facility]. Goal status: in progress  3.  Pt will demonstrate improved grip strength to >30 #  with LUE to demonstrate functional grasp. Baseline: TBD 06/29/23: R: 28# and L: 12.67# 07/01/23 - RUE: 28#,  35# (31.5# average). LUE:11#, 13# (12# on average) Goal status: in progress  4.  Pt will report improved functional use of LUE as evidenced by decreased score on QuickDASH by 25% Baseline: 54.5% impairment 06/29/23: 34.1% impairment Goal status: in progress  5.  Pt will decrease pain at worst from 5/10 to 1/10 or less during grasp and release and power grip in preparation for being able to play pain free golf with left hand and occupational participation in daily roles. Baseline:  07/01/23 - Pt reporting 0/10 or 1/10 pain levels for past 3 OT sessions.  Goal status: MET   ASSESSMENT:  CLINICAL IMPRESSION: Pt reported not using Paraffin wax or theraputty at home d/t busy schedule. OT educated pt on alternative HEP option of wringing towel/washcloth for grip strengthening. Pt returned demo. Pt tolerated tasks well today, primarily focusing on affected UE strengthening and ROM. Pt will continue to benefit from skilled occupational therapy services to address strength and coordination, ROM, pain management, GM/FM control, safety awareness, introduction of compensatory strategies/AE prn, and implementation of an HEP to improve participation and safety during ADLs, IADLs, and leisure pursuits.    PERFORMANCE  DEFICITS: in functional skills including ADLs, IADLs, coordination, edema, ROM, strength, pain, flexibility, Gross motor control, body mechanics, decreased knowledge of precautions, and UE functional use and psychosocial skills including environmental adaptation and routines and behaviors.      PLAN:  OT FREQUENCY: 2x/week  OT DURATION: 6 weeks  PLANNED INTERVENTIONS: 97168 OT Re-evaluation, 97535 self care/ADL training, 25366 therapeutic exercise, 97530 therapeutic activity, 97112 neuromuscular re-education, 97140 manual therapy, 97035 ultrasound, 97018 paraffin, 44034 moist heat, 97010 cryotherapy, 97760 Orthotics management and training, 74259 Splinting (initial encounter), [id number] Subsequent splinting/medication, passive range of motion, compression bandaging, coping strategies training, patient/family education, and DME and/or AE instructions  RECOMMENDED OTHER SERVICES: NA  CONSULTED AND AGREED WITH PLAN OF CARE: Patient  PLAN FOR NEXT SESSION: Review AROM/AAROM and theraputty, (or wringing towel as alterative to theraputty for grip strength)  continue to progress AAROM/PROM exercises within pain threshold, okay to begin hand exerciser, Continue to progress strengthening and ROM as tolerated    Oakley Bellman, OTR/L 07/01/2023, 11:55 AM  [facility] at [facility] [address] [place], [place] [address] Phone # [phone] Fax # [phone]

## 2023-07-06 ENCOUNTER — Ambulatory Visit: Admitting: Occupational Therapy

## 2023-07-06 DIAGNOSIS — M6281 Muscle weakness (generalized): Secondary | ICD-10-CM

## 2023-07-06 DIAGNOSIS — R278 Other lack of coordination: Secondary | ICD-10-CM

## 2023-07-06 DIAGNOSIS — M25532 Pain in left wrist: Secondary | ICD-10-CM | POA: Diagnosis not present

## 2023-07-06 DIAGNOSIS — M25632 Stiffness of left wrist, not elsewhere classified: Secondary | ICD-10-CM

## 2023-07-06 NOTE — Therapy (Signed)
 OUTPATIENT OCCUPATIONAL THERAPY ORTHO Treatment  Patient Name: Cynthia Pace MRN: [id number] DOB:[birth year], 63 y.o., female Today's Date: 07/06/2023  PCP: Audria Leather, MD REFERRING PROVIDER: Wes Hamman, MD  END OF SESSION:  OT End of Session - 07/06/23 1109     Visit Number 9    Number of Visits 13    Date for OT Re-Evaluation 07/17/23    Authorization Type [employer] 2025    OT Start Time 1106    OT Stop Time 1147    OT Time Calculation (min) 41 min    Activity Tolerance Patient tolerated treatment well    Behavior During Therapy WFL for tasks assessed/performed                    Past Medical History:  Diagnosis Date   Arthritis    "left ankle" (05/04/2015)   Asthma    Carpal tunnel syndrome    Chronic bronchitis (HCC)    Chronic lower back pain    Elevated liver enzymes    Environmental allergies    Family history of adverse reaction to anesthesia    "daughter died after 2nd epidural for C-section; they were able to bring her back"   GERD (gastroesophageal reflux disease)    History of kidney stones    History of TMJ syndrome    Hyperlipidemia    Hypertension    Kidney stones    Restless leg syndrome    Seasonal allergies    Past Surgical History:  Procedure Laterality Date   ABDOMINAL HYSTERECTOMY     ANKLE SURGERY Left    severed artery and tendons   APPENDECTOMY     CYSTOSCOPY W/ STONE MANIPULATION  X 1   FRACTURE SURGERY     JOINT REPLACEMENT     ORIF HUMERUS FRACTURE Left 05/03/2015   Procedure: OPEN REDUCTION INTERNAL FIXATION (ORIF) LEFT PROXIMAL HUMERUS FRACTURE NONUNION;  Surgeon: Jasmine Mesi, MD;  Location: [facility] OR;  Service: Orthopedics;  Laterality: Left;   TONSILLECTOMY     TOTAL HIP ARTHROPLASTY Left 12/29/2017   TOTAL HIP ARTHROPLASTY Left 12/29/2017   Procedure: LEFT TOTAL HIP ARTHROPLASTY ANTERIOR APPROACH;  Surgeon: Jasmine Mesi, MD;  Location: [facility] OR;  Service: Orthopedics;  Laterality: Left;    Patient Active Problem List   Diagnosis Date Noted   Closed fracture of left distal radius 04/22/2023   Avascular necrosis of bone of left hip (HCC)    Hip arthritis 12/29/2017   Lumbar pain 06/08/2017   Shoulder fracture, left 05/03/2015    ONSET DATE: fx 04/14/23 (referral date 05/28/23)  REFERRING DIAG: N82.956O (ICD-10-CM) - Other closed intra-articular fracture of distal end of left radius, initial encounter M25.532 (ICD-10-CM) - Pain in left wrist  THERAPY DIAG:  Pain in left wrist  Stiffness of left wrist, not elsewhere classified  Muscle weakness (generalized)  Other lack of coordination  Rationale for Evaluation and Treatment: Rehabilitation  SUBJECTIVE:   SUBJECTIVE STATEMENT: Pt reports going to the driving range and hitting golf balls off a rubber tee in the grass.  Pt reports decreased ROM and but no pain.   Pt reports that she did use ice and heat later in the day for pain management.    Pt accompanied by: self  PERTINENT HISTORY: 63 year old female who presented to ED 04/14/23 with left wrist pain.  Patient reports that she tripped and fell catching herself on her left wrist.  She did have some alcohol tonight.  X-rays reviewed and  show distal radius fracture with an ulnar styloid fracture.  Patient was placed in a sugar-tong splint and given a sling.  Cast removed by Dr. Christiane [name] 05/28/23 and prefabricated wrist orthosis donned.  PRECAUTIONS: None  RED FLAGS: None   WEIGHT BEARING RESTRICTIONS: No  PAIN:  Are you having pain? Yes: NPRS scale: <1/10 Pain location: radial and ulnar side of wrist Pain description: dull Aggravating factors: using it Relieving factors: Advil  FALLS: Has patient fallen in last 6 months? Yes. 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  "This was the first and only fall this year".  LIVING ENVIRONMENT: Lives with: lives  with their spouse Lives in: House/apartment Stairs: Yes: Internal: full flights of steps; and External: threshold steps Has following equipment at home: Walker - 2 wheeled - not currently using it  PLOF: Independent, Independent with basic ADLs, Independent with community mobility without device, Independent with homemaking with ambulation, and Leisure: playing golf  PATIENT GOALS: to be able to swing a club, decreased pain with household chores  NEXT MD VISIT: 07/09/23  OBJECTIVE:  Note: Objective measures were completed at Evaluation unless otherwise noted.  HAND DOMINANCE: Right  ADLs: Overall ADLs: difficulty opening pill bottles, difficulty with getting dressed due to decreased grasp and pain  Grooming: decreased strength to push toothpaste Upper body dressing: clothing fasteners Lower body dressing: mild difficulty and pain with pulling up pants  FUNCTIONAL OUTCOME MEASURES: Quick Dash: 54.5%   QuickDASH: 34.1%   UPPER EXTREMITY ROM:     Active ROM Right eval Left eval  Shoulder flexion    Shoulder abduction    Shoulder adduction    Shoulder extension    Shoulder internal rotation    Shoulder external rotation    Elbow flexion    Elbow extension    Wrist flexion  80-90 60 12  Wrist extension  60-80 55 28  Wrist ulnar deviation  30-45 42 18  Wrist radial deviation 15-20 15 8   Wrist pronation WFL Pain  Wrist supination WFL 80% with increased pain  (Blank rows = not tested)  HAND FUNCTION: TBD - loose fist on L  COORDINATION: TBD  SENSATION: WFL  EDEMA: mild swelling in dorsal and volar aspect of L wrist  COGNITION: Overall cognitive status: Within functional limits for tasks assessed   TREATMENT DATE:  07/06/23 Wrist ROM: engaged in flexion/extension, ulnar/radial deviation, and forearm supination/pronation while rolling weighted ball on table top.  OT providing demonstration of each and cues to maintain stretch ~10 seconds, especially with wrist  extension and pronation.   Wringing washcloth/dish towel - 2 sets of 10.  OT reiterated education on use of variable thickness of towel to challenge grip strength using different grasp patterns - to aid in improving affected UE grip strength as needed for improving gross motor control. Pegs: engaged in picking up, rotation, placing, and removing large grip pegs into resistance peg board to facilitate wrist flexion/extension and radial/ulnar deviation when placing and removing pegs from peg board.  OT educating on functional carryover to pt placing golf tee into the ground and removing.   PATIENT EDUCATION: Education details: see today's tx above Person educated: Patient Education method: Explanation, Demonstration, and Handouts Education comprehension: verbalized understanding and needs further education  HOME EXERCISE PROGRAM: 06/02/23 - AROM (wrist flexion/extension, supination/pronation) see pt instructions 06/10/23 - yellow theraputty, Access Code: [email or web address]. Wrist prayer  stretch AAROM (see pt instructions) 06/24/23 - wrist PROM flex, Access Code: [email or web address] (same as above) 07/01/23 - Verbal instructions: wringing washcloth/dish towel for grip strength - 10 reps, 2 sets, 2x per day (alternative option to theraputty)  GOALS: Goals reviewed with patient? Yes  SHORT TERM GOALS: Target date: 06/26/23  Pt will be independent in AROM, PROM, and strengthening HEP with use of handouts. Baseline: new to OPOT 07/06/23 - completes exercises intermittently, she does state that she will frequently complete the prayer stretch Goal status: in progress  2.  Pt will be independent with splint wear and care and report understanding of progressive decrease in wear time of orthosis and/or times when she may continue to benefit from wear of splint. Baseline: wrist orthosis provided by MD 06/24/23 - Pt no longer wearing splint except sometimes for community activities. Pt verbalized understanding of weaning from  splint wear schedule. Goal status: MET  3.  Pt will verbalize understanding of improved sleep positioning to decrease onset of pain and tingling as well as reduction of edema. Baseline: pain, tingling, and swelling after sleep 06/24/23 - Pt reported pain, tingling symptoms have improved. Pt ind recalled using pillows as supports to improve sleep positioning.  Goal status: MET  4.  Pt will verbalize understanding of use of modalities to decrease inflammation/pain.  Baseline: new to OPOT 06/24/23 - Pt reported using heat pack and paraffin wax to relax muscles and to manage pain.  Pt ind recalled using ice to reduce swelling though not using ice as often as heat. Goal status: MET  5.  Pt will verbalize understanding of task modifications and/or potential AE needs to increase ease, safety, and independence with IADLs.  Baseline: pain with cooking, chopping foods 06/24/23 - Pt reported return to cooking tasks. Pt reported sometimes "favoring" L hand and not always using as much strength "as I should be." Pt reported chopping foods "fine, a lot better." Goal status: in progress   LONG TERM GOALS: Target date: 07/17/23  Pt will demonstrate improved UE functional use for ADLs as evidenced by increasing box/ blocks score by 5 blocks with LUE. Baseline:  06/24/23 - RUE: 52 blocks, LUE: 51 blocks Goal status: MET  2.  Pt will improve A/ROM in left wrist for flexion and extension to at least 90% as compared to right, to have functional motion for tasks like reach and grasp.  Baseline: LUE ROM at 50% or less of RUE 06/24/23 - Continued stiffness of LUE compared to RUE. LUE impaired wrist flex. LUE wrist ext [facility]. Goal status: in progress  3.  Pt will demonstrate improved grip strength to >30 #  with LUE to demonstrate functional grasp. Baseline: TBD 06/29/23: R: 28# and L: 12.67# 07/01/23 - RUE: 28#, 35# (31.5# average). LUE:11#, 13# (12# on average) Goal status: in progress  4.  Pt will report improved  functional use of LUE as evidenced by decreased score on QuickDASH by 25% Baseline: 54.5% impairment 06/29/23: 34.1% impairment Goal status: in progress  5.  Pt will decrease pain at worst from 5/10 to 1/10 or less during grasp and release and power grip in preparation for being able to play pain free golf with left hand and occupational participation in daily roles. Baseline:  07/01/23 - Pt reporting 0/10 or 1/10 pain levels for past 3 OT sessions.  Goal status: MET   ASSESSMENT:  CLINICAL IMPRESSION: Pt reports utilizing heat and cold over the weekend for pain management as she reports going to the driving  range and hitting golf balls.  Pt reports no pain during task, but reports that she might have "overdone it" that day between hitting golf balls and other errands and activities.  Pt tolerated tasks well this session, however demonstrating min difficulty with pronation.  Session continues to focus on increased ROM, strength, and functional carryover.  Pt will continue to benefit from skilled occupational therapy services to address strength and coordination, ROM, pain management, GM/FM control, safety awareness, introduction of compensatory strategies/AE prn, and implementation of an HEP to improve participation and safety during ADLs, IADLs, and leisure pursuits.    PERFORMANCE DEFICITS: in functional skills including ADLs, IADLs, coordination, edema, ROM, strength, pain, flexibility, Gross motor control, body mechanics, decreased knowledge of precautions, and UE functional use and psychosocial skills including environmental adaptation and routines and behaviors.      PLAN:  OT FREQUENCY: 2x/week  OT DURATION: 6 weeks  PLANNED INTERVENTIONS: 97168 OT Re-evaluation, 97535 self care/ADL training, 04540 therapeutic exercise, 97530 therapeutic activity, 97112 neuromuscular re-education, 97140 manual therapy, 97035 ultrasound, 97018 paraffin, 98119 moist heat, 97010 cryotherapy, 97760  Orthotics management and training, 14782 Splinting (initial encounter), [phone] Subsequent splinting/medication, passive range of motion, compression bandaging, coping strategies training, patient/family education, and DME and/or AE instructions  RECOMMENDED OTHER SERVICES: NA  CONSULTED AND AGREED WITH PLAN OF CARE: Patient  PLAN FOR NEXT SESSION:  10th Visit note at next session  Review AROM/AAROM and theraputty, (or wringing towel as alterative to theraputty for grip strength)  continue to progress AAROM/PROM exercises within pain threshold, okay to begin hand exerciser, Continue to progress strengthening and ROM as tolerated    Areliz Rothman, OTR/L 07/06/2023, 12:42 PM  [facility] at [facility] [address] [place], [place] [address] Phone # [phone] Fax # [phone]

## 2023-07-08 ENCOUNTER — Ambulatory Visit: Admitting: Occupational Therapy

## 2023-07-08 DIAGNOSIS — R278 Other lack of coordination: Secondary | ICD-10-CM

## 2023-07-08 DIAGNOSIS — M25532 Pain in left wrist: Secondary | ICD-10-CM

## 2023-07-08 DIAGNOSIS — M25632 Stiffness of left wrist, not elsewhere classified: Secondary | ICD-10-CM

## 2023-07-08 DIAGNOSIS — M6281 Muscle weakness (generalized): Secondary | ICD-10-CM

## 2023-07-08 NOTE — Therapy (Addendum)
 OUTPATIENT OCCUPATIONAL THERAPY ORTHO Treatment / PROGRESS NOTE  Patient Name: Cynthia Pace MRN: [id number] DOB:1960/05/14, 63 y.o., female Today's Date: 07/08/2023  PCP: Audria Leather, MD REFERRING PROVIDER: Wes Hamman, MD  END OF SESSION:  OT End of Session - 07/08/23 1306     Visit Number 10    Number of Visits 13    Date for OT Re-Evaluation 07/17/23    Authorization Type [employer] 2025    OT Start Time 1105    OT Stop Time 1145    OT Time Calculation (min) 40 min    Activity Tolerance Patient tolerated treatment well    Behavior During Therapy WFL for tasks assessed/performed                    Past Medical History:  Diagnosis Date   Arthritis    "left ankle" (05/04/2015)   Asthma    Carpal tunnel syndrome    Chronic bronchitis (HCC)    Chronic lower back pain    Elevated liver enzymes    Environmental allergies    Family history of adverse reaction to anesthesia    "daughter died after 2nd epidural for C-section; they were able to bring her back"   GERD (gastroesophageal reflux disease)    History of kidney stones    History of TMJ syndrome    Hyperlipidemia    Hypertension    Kidney stones    Restless leg syndrome    Seasonal allergies    Past Surgical History:  Procedure Laterality Date   ABDOMINAL HYSTERECTOMY     ANKLE SURGERY Left    severed artery and tendons   APPENDECTOMY     CYSTOSCOPY W/ STONE MANIPULATION  X 1   FRACTURE SURGERY     JOINT REPLACEMENT     ORIF HUMERUS FRACTURE Left 05/03/2015   Procedure: OPEN REDUCTION INTERNAL FIXATION (ORIF) LEFT PROXIMAL HUMERUS FRACTURE NONUNION;  Surgeon: Jasmine Mesi, MD;  Location: [facility] OR;  Service: Orthopedics;  Laterality: Left;   TONSILLECTOMY     TOTAL HIP ARTHROPLASTY Left 12/29/2017   TOTAL HIP ARTHROPLASTY Left 12/29/2017   Procedure: LEFT TOTAL HIP ARTHROPLASTY ANTERIOR APPROACH;  Surgeon: Jasmine Mesi, MD;  Location: [facility] OR;  Service: Orthopedics;  Laterality:  Left;   Patient Active Problem List   Diagnosis Date Noted   Closed fracture of left distal radius 04/22/2023   Avascular necrosis of bone of left hip (HCC)    Hip arthritis 12/29/2017   Lumbar pain 06/08/2017   Shoulder fracture, left 05/03/2015    ONSET DATE: fx 04/14/23 (referral date 05/28/23)  REFERRING DIAG: Z30.865H (ICD-10-CM) - Other closed intra-articular fracture of distal end of left radius, initial encounter M25.532 (ICD-10-CM) - Pain in left wrist  THERAPY DIAG:  Stiffness of left wrist, not elsewhere classified  Muscle weakness (generalized)  Other lack of coordination  Pain in left wrist  Rationale for Evaluation and Treatment: Rehabilitation  SUBJECTIVE:   SUBJECTIVE STATEMENT: Pt reported no pain. Pt reported went to golf driving range recently. Pt reported "I was connecting with the ball, but I wasn't hitting it straight." Pt reported compensating for decreased wrist range of motion by dropping L shoulder. Pt reported completing theraputty exercises. Pt reported upcoming appointment with Dr. Christiane [name] tomorrow.  Pt accompanied by: self  PERTINENT HISTORY: 63 year old female who presented to ED 04/14/23 with left wrist pain.  Patient reports that she tripped and fell catching herself on her left wrist.  She did  have some alcohol tonight.  X-rays reviewed and show distal radius fracture with an ulnar styloid fracture.  Patient was placed in a sugar-tong splint and given a sling.  Cast removed by Dr. Christiane [name] 05/28/23 and prefabricated wrist orthosis donned.  PRECAUTIONS: None  RED FLAGS: None   WEIGHT BEARING RESTRICTIONS: No  PAIN:  Are you having pain? Yes: NPRS scale: 0/10 at wrist, 1-2/10 at L hand (d/t arthritis, rainy weather per pt) Pain location: hand Pain description: dull Aggravating factors: rainy weather, hx of arthritis Relieving factors:    FALLS: Has patient fallen in last 6 months? Yes. Number of falls "I fall all the time" - pt reports having a ankle  with severed tendons and she loses her balance occasionally but reports 1-2 max per year.  "This was the first and only fall this year".  LIVING ENVIRONMENT: Lives with: lives with their spouse Lives in: House/apartment Stairs: Yes: Internal: full flights of steps; and External: threshold steps Has following equipment at home: Walker - 2 wheeled - not currently using it  PLOF: Independent, Independent with basic ADLs, Independent with community mobility without device, Independent with homemaking with ambulation, and Leisure: playing golf  PATIENT GOALS: to be able to swing a club, decreased pain with household chores  NEXT MD VISIT: 07/09/23  OBJECTIVE:  Note: Objective measures were completed at Evaluation unless otherwise noted.  HAND DOMINANCE: Right  ADLs: Overall ADLs: difficulty opening pill bottles, difficulty with getting dressed due to decreased grasp and pain  Grooming: decreased strength to push toothpaste Upper body dressing: clothing fasteners Lower body dressing: mild difficulty and pain with pulling up pants  FUNCTIONAL OUTCOME MEASURES: Quick Dash: 54.5%   QuickDASH: 34.1%   07/08/23 - 22.7% deficit   UPPER EXTREMITY ROM:     Active ROM Right eval Left eval  Shoulder flexion    Shoulder abduction    Shoulder adduction    Shoulder extension    Shoulder internal rotation    Shoulder external rotation    Elbow flexion    Elbow extension    Wrist flexion  80-90 60 12  Wrist extension  60-80 55 28  Wrist ulnar deviation  30-45 42 18  Wrist radial deviation 15-20 15 8   Wrist pronation WFL Pain  Wrist supination WFL 80% with increased pain  (Blank rows = not tested)  HAND FUNCTION: TBD - loose fist on L  COORDINATION: TBD  SENSATION: WFL  EDEMA: mild swelling in dorsal and volar aspect of L wrist  COGNITION: Overall cognitive status: Within functional limits for tasks assessed   TREATMENT DATE:   TherAct OT assessed pt's progress  towards goals, see below for updates.   Removing pegs with Hand gripper (black spring, level 1, 15#) with affected UE - to improve affected UE strengthening and gross motor coordination.   Picking up jacks from tabletop with focus on wrist flex AROM - to improve affected UE ROM, coordination.   Self-Care OT educated pt on avoiding compensating with L shoulder drop during golf swings and practicing good form. Pt acknowledged understanding.   OT reviewed option for heat modality before exercises and ice after exercises to prevent soreness, reduce stiffness, manage pain. Pt verbalized understanding.  TherEx PROM wrist flex hold - 60 s, 5 sets - to improve affected UE ROM. Pt returned demo. OT recommended to pt to use timer at home d/t pt frequently releasing hold too quickly. Pt reported pain level increase to 1-2/10 during exercise. OT educated pt on  pain threshold, avoiding pain level increase >4/10. Pt verbalized understanding.  Prayer stretch wrist ext - 60 s hold, 5 sets - to reduce pain/discomfort. OT instructed pt to complete wrist prayer ext between sets of PROM wrist flex hold at home. Pt acknowledged understanding.  PATIENT EDUCATION: Education details: see today's tx above Person educated: Patient Education method: Explanation, Demonstration, and Handouts Education comprehension: verbalized understanding and needs further education  HOME EXERCISE PROGRAM: 06/02/23 - AROM (wrist flexion/extension, supination/pronation) see pt instructions 06/10/23 - yellow theraputty, Access Code: [email or web address]. Wrist prayer stretch AAROM (see pt instructions) 06/24/23 - wrist PROM flex, Access Code: [email or web address] (same as above) 07/01/23 - Verbal instructions: wringing washcloth/dish towel for grip strength - 10 reps, 2 sets, 2x per day (alternative option to theraputty)  GOALS: Goals reviewed with patient? Yes  SHORT TERM GOALS: Target date: 06/26/23  Pt will be independent in AROM, PROM, and strengthening  HEP with use of handouts. Baseline: new to OPOT 07/08/23 - Pt reported no questions/concerns about HEP. Goal status: in progress  2.  Pt will be independent with splint wear and care and report understanding of progressive decrease in wear time of orthosis and/or times when she may continue to benefit from wear of splint. Baseline: wrist orthosis provided by MD 06/24/23 - Pt no longer wearing splint except sometimes for community activities. Pt verbalized understanding of weaning from splint wear schedule. Goal status: MET  3.  Pt will verbalize understanding of improved sleep positioning to decrease onset of pain and tingling as well as reduction of edema. Baseline: pain, tingling, and swelling after sleep 06/24/23 - Pt reported pain, tingling symptoms have improved. Pt ind recalled using pillows as supports to improve sleep positioning.  Goal status: MET  4.  Pt will verbalize understanding of use of modalities to decrease inflammation/pain.  Baseline: new to OPOT 06/24/23 - Pt reported using heat pack and paraffin wax to relax muscles and to manage pain.  Pt ind recalled using ice to reduce swelling though not using ice as often as heat. Goal status: MET  5.  Pt will verbalize understanding of task modifications and/or potential AE needs to increase ease, safety, and independence with IADLs.  Baseline: pain with cooking, chopping foods 06/24/23 - Pt reported return to cooking tasks. Pt reported sometimes "favoring" L hand and not always using as much strength "as I should be." Pt reported chopping foods "fine, a lot better." 07/08/23 - Pt reported returned to "pretty much everything." Pt reported some difficulty with carrying heavy trays and completing golf swing. Goal status: in progress   LONG TERM GOALS: Target date: 07/17/23  Pt will demonstrate improved UE functional use for ADLs as evidenced by increasing box/ blocks score by 5 blocks with LUE. Baseline:  06/24/23 - RUE: 52 blocks,  LUE: 51 blocks Goal status: MET  2.  Pt will improve A/ROM in left wrist for flexion and extension to at least 90% as compared to right, to have functional motion for tasks like reach and grasp.  Baseline: LUE ROM at 50% or less of RUE 06/24/23 - Continued stiffness of LUE compared to RUE. LUE impaired wrist flex. LUE wrist ext [facility]. 07/08/23 - LUE wrist flex: 32* (compared to RUE wrist flex: 59*), LUE wrist ext: 50* WFL (compared to RUE wrist ext: 50*. Goal status: in progress  3.  Pt will demonstrate improved grip strength to >30 #  with LUE to demonstrate functional grasp. Baseline: TBD 06/29/23: R: 28# and L: 12.67# 07/01/23 - RUE:  28#, 35# (31.5# average). LUE:11#, 13# (12# on average) 07/08/23 - LUE: 14, 15, 12 (13.7 lbs average) Goal status: in progress  4.  Pt will report improved functional use of LUE as evidenced by decreased score on QuickDASH by 25% Baseline: 54.5% impairment 06/29/23: 34.1% impairment 07/08/23 - 22.7% deficit Goal status: MET  5.  Pt will decrease pain at worst from 5/10 to 1/10 or less during grasp and release and power grip in preparation for being able to play pain free golf with left hand and occupational participation in daily roles. Baseline:  07/01/23 - Pt reporting 0/10 or 1/10 pain levels for past 3 OT sessions.  Goal status: MET   ASSESSMENT:  CLINICAL IMPRESSION: Pt demo'ing steady progress towards goals and tolerated tasks well. Pt's primarily limited by stiffness and decreased ROM for wrist flex of affected UE.   This 10th progress note is for dates: 06/02/23 to 07/08/2023. Pt has met 3 out of 5 STGs and 3 out of 5 LTGs. Pt making progress towards goals as expected and continues to benefit from skilled OT services in the outpatient setting to work towards remaining goals or until max rehab potential is met.  Pt will continue to benefit from skilled occupational therapy services to address strength and coordination, ROM, pain management, GM/FM control,  safety awareness, introduction of compensatory strategies/AE prn, and implementation of an HEP to improve participation and safety during ADLs, IADLs, and leisure pursuits.    PERFORMANCE DEFICITS: in functional skills including ADLs, IADLs, coordination, edema, ROM, strength, pain, flexibility, Gross motor control, body mechanics, decreased knowledge of precautions, and UE functional use and psychosocial skills including environmental adaptation and routines and behaviors.      PLAN:  OT FREQUENCY: 2x/week  OT DURATION: 6 weeks  PLANNED INTERVENTIONS: 97168 OT Re-evaluation, 97535 self care/ADL training, 16109 therapeutic exercise, 97530 therapeutic activity, 97112 neuromuscular re-education, 97140 manual therapy, 97035 ultrasound, 97018 paraffin, 60454 moist heat, 97010 cryotherapy, 97760 Orthotics management and training, 09811 Splinting (initial encounter), [id number] Subsequent splinting/medication, passive range of motion, compression bandaging, coping strategies training, patient/family education, and DME and/or AE instructions  RECOMMENDED OTHER SERVICES: NA  CONSULTED AND AGREED WITH PLAN OF CARE: Patient  PLAN FOR NEXT SESSION:  Updates from Dr. Christiane [name]? Pt seeing Dr. Christiane [name] on ?07/09/23  Review AROM/AAROM and theraputty, (or wringing towel as alterative to theraputty for grip strength)  continue to progress AAROM/PROM exercises within pain threshold, okay to begin hand exerciser, Continue to progress strengthening and ROM as tolerated    Oakley Bellman, OTR/L 07/08/2023, 1:13 PM  [facility] [address], [address] [place], [place] [address] Phone # [phone] Fax # [phone]

## 2023-07-09 ENCOUNTER — Other Ambulatory Visit (INDEPENDENT_AMBULATORY_CARE_PROVIDER_SITE_OTHER): Payer: Self-pay

## 2023-07-09 ENCOUNTER — Ambulatory Visit: Admitting: Orthopaedic Surgery

## 2023-07-09 ENCOUNTER — Encounter: Payer: Self-pay | Admitting: Orthopaedic Surgery

## 2023-07-09 DIAGNOSIS — S52572A Other intraarticular fracture of lower end of left radius, initial encounter for closed fracture: Secondary | ICD-10-CM

## 2023-07-09 NOTE — Progress Notes (Signed)
 Office Visit Note   Patient: Cynthia Pace           Date of Birth: 1960-09-24           MRN: [id number] Visit Date: 07/09/2023              Requested by: Audria Leather, MD [id number],  [place] [address] PCP: Audria Leather, MD   Assessment & Plan: Visit Diagnoses:  1. Other closed intra-articular fracture of distal end of left radius, initial encounter     Plan: History of Present Illness Cynthia Pace is a 63 year old female who presents for follow-up of a left wrist fracture.  Three months post-injury, she experiences occasional pain and stiffness in the wrist. She attends therapy sessions twice a week and uses Advil for pain management. She reports a sensation of 'bumping' in the wrist with pain that radiates during activity. Attempting to swing golf clubs reveals that her wrist function is not fully restored, affecting her swing.  Results RADIOLOGY Wrist X-ray: Fully healed distal radius fracture and ulnar styloid fracture.   Examination of the left wrist shows satisfactory range of motion.  No bony tenderness.  Assessment and Plan Healed the left distal radius fracture. -May advance activity as tolerated.  Follow-up as needed.  Follow-Up Instructions: No follow-ups on file.   Orders:  Orders Placed This Encounter  Procedures   XR Wrist Complete Left   No orders of the defined types were placed in this encounter.     Procedures: No procedures performed   Clinical Data: No additional findings.   Subjective: Chief Complaint  Patient presents with   Left Wrist - Follow-up    Left distal radius fracture     HPI  Review of Systems   Objective: Vital Signs: There were no vitals taken for this visit.  Physical Exam  Ortho Exam  Specialty Comments:  No specialty comments available.  Imaging: XR Wrist Complete Left Result Date: 07/09/2023 X-rays of the left wrist show complete fracture consolidation of the distal radius and  ulnar styloid fracture.    PMFS History: Patient Active Problem List   Diagnosis Date Noted   Closed fracture of left distal radius 04/22/2023   Avascular necrosis of bone of left hip (HCC)    Hip arthritis 12/29/2017   Lumbar pain 06/08/2017   Shoulder fracture, left 05/03/2015   Past Medical History:  Diagnosis Date   Arthritis    "left ankle" (05/04/2015)   Asthma    Carpal tunnel syndrome    Chronic bronchitis (HCC)    Chronic lower back pain    Elevated liver enzymes    Environmental allergies    Family history of adverse reaction to anesthesia    "daughter died after 2nd epidural for C-section; they were able to bring her back"   GERD (gastroesophageal reflux disease)    History of kidney stones    History of TMJ syndrome    Hyperlipidemia    Hypertension    Kidney stones    Restless leg syndrome    Seasonal allergies     Family History  Problem Relation Age of Onset   Tuberculosis Mother    Heart disease Mother    Cancer Father     Past Surgical History:  Procedure Laterality Date   ABDOMINAL HYSTERECTOMY     ANKLE SURGERY Left    severed artery and tendons   APPENDECTOMY     CYSTOSCOPY W/ STONE  MANIPULATION  X 1   FRACTURE SURGERY     JOINT REPLACEMENT     ORIF HUMERUS FRACTURE Left 05/03/2015   Procedure: OPEN REDUCTION INTERNAL FIXATION (ORIF) LEFT PROXIMAL HUMERUS FRACTURE NONUNION;  Surgeon: Jasmine Mesi, MD;  Location: [facility] OR;  Service: Orthopedics;  Laterality: Left;   TONSILLECTOMY     TOTAL HIP ARTHROPLASTY Left 12/29/2017   TOTAL HIP ARTHROPLASTY Left 12/29/2017   Procedure: LEFT TOTAL HIP ARTHROPLASTY ANTERIOR APPROACH;  Surgeon: Jasmine Mesi, MD;  Location: [facility] OR;  Service: Orthopedics;  Laterality: Left;   Social History   Occupational History   Not on file  Tobacco Use   Smoking status: Every Day    Current packs/day: 1.00    Average packs/day: 1 pack/day for 18.0 years (18.0 ttl pk-yrs)    Types: Cigarettes   Smokeless  tobacco: Never  Vaping Use   Vaping status: Former   Devices: patient states only used one time  Substance and Sexual Activity   Alcohol use: Yes    Alcohol/week: 1.0 - 2.0 standard drink of alcohol    Types: 1 - 2 Glasses of wine per week   Drug use: No   Sexual activity: Not Currently

## 2023-07-13 ENCOUNTER — Ambulatory Visit: Admitting: Occupational Therapy

## 2023-07-13 DIAGNOSIS — M25632 Stiffness of left wrist, not elsewhere classified: Secondary | ICD-10-CM

## 2023-07-13 DIAGNOSIS — M25532 Pain in left wrist: Secondary | ICD-10-CM

## 2023-07-13 DIAGNOSIS — R278 Other lack of coordination: Secondary | ICD-10-CM

## 2023-07-13 DIAGNOSIS — M6281 Muscle weakness (generalized): Secondary | ICD-10-CM

## 2023-07-13 NOTE — Therapy (Signed)
 OUTPATIENT OCCUPATIONAL THERAPY ORTHO Treatment  Patient Name: Cynthia Pace MRN: [id number] DOB:November 19, 1960, 64 y.o., female Today's Date: 07/13/2023  PCP: Audria Leather, MD REFERRING PROVIDER: Wes Hamman, MD  END OF SESSION:  OT End of Session - 07/13/23 1156     Visit Number 11    Number of Visits 13    Date for OT Re-Evaluation 07/17/23    Authorization Type [employer] 2025    OT Start Time 1150    OT Stop Time 1232    OT Time Calculation (min) 42 min    Activity Tolerance Patient tolerated treatment well    Behavior During Therapy WFL for tasks assessed/performed                     Past Medical History:  Diagnosis Date   Arthritis    "left ankle" (05/04/2015)   Asthma    Carpal tunnel syndrome    Chronic bronchitis (HCC)    Chronic lower back pain    Elevated liver enzymes    Environmental allergies    Family history of adverse reaction to anesthesia    "daughter died after 2nd epidural for C-section; they were able to bring her back"   GERD (gastroesophageal reflux disease)    History of kidney stones    History of TMJ syndrome    Hyperlipidemia    Hypertension    Kidney stones    Restless leg syndrome    Seasonal allergies    Past Surgical History:  Procedure Laterality Date   ABDOMINAL HYSTERECTOMY     ANKLE SURGERY Left    severed artery and tendons   APPENDECTOMY     CYSTOSCOPY W/ STONE MANIPULATION  X 1   FRACTURE SURGERY     JOINT REPLACEMENT     ORIF HUMERUS FRACTURE Left 05/03/2015   Procedure: OPEN REDUCTION INTERNAL FIXATION (ORIF) LEFT PROXIMAL HUMERUS FRACTURE NONUNION;  Surgeon: Jasmine Mesi, MD;  Location: [facility] OR;  Service: Orthopedics;  Laterality: Left;   TONSILLECTOMY     TOTAL HIP ARTHROPLASTY Left 12/29/2017   TOTAL HIP ARTHROPLASTY Left 12/29/2017   Procedure: LEFT TOTAL HIP ARTHROPLASTY ANTERIOR APPROACH;  Surgeon: Jasmine Mesi, MD;  Location: [facility] OR;  Service: Orthopedics;  Laterality: Left;    Patient Active Problem List   Diagnosis Date Noted   Closed fracture of left distal radius 04/22/2023   Avascular necrosis of bone of left hip (HCC)    Hip arthritis 12/29/2017   Lumbar pain 06/08/2017   Shoulder fracture, left 05/03/2015    ONSET DATE: fx 04/14/23 (referral date 05/28/23)  REFERRING DIAG: W09.811B (ICD-10-CM) - Other closed intra-articular fracture of distal end of left radius, initial encounter M25.532 (ICD-10-CM) - Pain in left wrist  THERAPY DIAG:  Stiffness of left wrist, not elsewhere classified  Muscle weakness (generalized)  Other lack of coordination  Pain in left wrist  Rationale for Evaluation and Treatment: Rehabilitation  SUBJECTIVE:   SUBJECTIVE STATEMENT: Pt reports that Dr. Christiane [name] stated that she "could do whatever I want".  Pt reports playing golf on Saturday. She reports hitting off the tee, right up the middle! Pt reports playing up to the 15th hole.  Pt reports that her pain was "less than I thought it would be".  Pt reports that she did take some Advil around 12th hole and then did ice when she got home.   Per Dr. Christiane [name] visit 07/09/23: Imaging: XR Wrist Complete Left Result Date: 07/09/2023 X-rays of the  left wrist show complete fracture consolidation of the distal radius and ulnar styloid fracture.  Pt accompanied by: self  PERTINENT HISTORY: 63 year old female who presented to ED 04/14/23 with left wrist pain.  Patient reports that she tripped and fell catching herself on her left wrist.  She did have some alcohol tonight.  X-rays reviewed and show distal radius fracture with an ulnar styloid fracture.  Patient was placed in a sugar-tong splint and given a sling.  Cast removed by Dr. Christiane [name] 05/28/23 and prefabricated wrist orthosis donned.  PRECAUTIONS: None  RED FLAGS: None   WEIGHT BEARING RESTRICTIONS: No  PAIN:  Are you having pain? Yes: NPRS scale: 1-2/10 at L hand (d/t arthritis) Pain location: hand Pain description: dull Aggravating  factors: rainy weather, hx of arthritis Relieving factors:    FALLS: Has patient fallen in last 6 months? Yes. 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  "This was the first and only fall this year".  LIVING ENVIRONMENT: Lives with: lives with their spouse Lives in: House/apartment Stairs: Yes: Internal: full flights of steps; and External: threshold steps Has following equipment at home: Walker - 2 wheeled - not currently using it  PLOF: Independent, Independent with basic ADLs, Independent with community mobility without device, Independent with homemaking with ambulation, and Leisure: playing golf  PATIENT GOALS: to be able to swing a club, decreased pain with household chores  NEXT MD VISIT: 07/09/23  OBJECTIVE:  Note: Objective measures were completed at Evaluation unless otherwise noted.  HAND DOMINANCE: Right  ADLs: Overall ADLs: difficulty opening pill bottles, difficulty with getting dressed due to decreased grasp and pain  Grooming: decreased strength to push toothpaste Upper body dressing: clothing fasteners Lower body dressing: mild difficulty and pain with pulling up pants  FUNCTIONAL OUTCOME MEASURES: Quick Dash: 54.5%   QuickDASH: 34.1%   07/08/23 - 22.7% deficit   UPPER EXTREMITY ROM:     Active ROM Right eval Left eval  Shoulder flexion    Shoulder abduction    Shoulder adduction    Shoulder extension    Shoulder internal rotation    Shoulder external rotation    Elbow flexion    Elbow extension    Wrist flexion  80-90 60 12  Wrist extension  60-80 55 28  Wrist ulnar deviation  30-45 42 18  Wrist radial deviation 15-20 15 8   Wrist pronation WFL Pain  Wrist supination WFL 80% with increased pain  (Blank rows = not tested)  HAND FUNCTION: TBD - loose fist on L  COORDINATION: TBD  SENSATION: WFL  EDEMA: mild swelling in  dorsal and volar aspect of L wrist  COGNITION: Overall cognitive status: Within functional limits for tasks assessed   TREATMENT DATE:  07/13/23 Flex bar: engaged in massed practice with wrist flexion on red flex bar.  OT providing demonstration and cues for sustained hold for 3-5 seconds.  Pt completing x15 with focus on improved flexion and incorporation of grip strength. Hand gripper:  with LUE on level 10# with yellow spring, progressing to #15 with black spring, and ultimately 20# with yellow spring.  Pt picking up 1 inch blocks with gripper with 0 drops with 10# and 15# springs.  Pt demonstrating decreased sequencing of task with increased resistance.  Pt able to complete task with 20# resistance spring with increased time and reports increased pain in ulnar distribution  and through 4th digit (due to previous injury). Moist heat: OT reiterated use of moist heat for pain management and as preparatory task for wrist flexion stretch.  Pt reports that she has been using ice for pain management after playing golf but will plan to begin using heat for increased ROM and in preparation for exercises.    PATIENT EDUCATION: Education details: see today's tx above Person educated: Patient Education method: Explanation, Demonstration, and Handouts Education comprehension: verbalized understanding and needs further education  HOME EXERCISE PROGRAM: 06/02/23 - AROM (wrist flexion/extension, supination/pronation) see pt instructions 06/10/23 - yellow theraputty, Access Code: [email or web address]. Wrist prayer stretch AAROM (see pt instructions) 06/24/23 - wrist PROM flex, Access Code: [email or web address] (same as above) 07/01/23 - Verbal instructions: wringing washcloth/dish towel for grip strength - 10 reps, 2 sets, 2x per day (alternative option to theraputty)  GOALS: Goals reviewed with patient? Yes  SHORT TERM GOALS: Target date: 06/26/23  Pt will be independent in AROM, PROM, and strengthening HEP with use of  handouts. Baseline: new to OPOT 07/08/23 - Pt reported no questions/concerns about HEP. Goal status: in progress  2.  Pt will be independent with splint wear and care and report understanding of progressive decrease in wear time of orthosis and/or times when she may continue to benefit from wear of splint. Baseline: wrist orthosis provided by MD 06/24/23 - Pt no longer wearing splint except sometimes for community activities. Pt verbalized understanding of weaning from splint wear schedule. Goal status: MET  3.  Pt will verbalize understanding of improved sleep positioning to decrease onset of pain and tingling as well as reduction of edema. Baseline: pain, tingling, and swelling after sleep 06/24/23 - Pt reported pain, tingling symptoms have improved. Pt ind recalled using pillows as supports to improve sleep positioning.  Goal status: MET  4.  Pt will verbalize understanding of use of modalities to decrease inflammation/pain.  Baseline: new to OPOT 06/24/23 - Pt reported using heat pack and paraffin wax to relax muscles and to manage pain.  Pt ind recalled using ice to reduce swelling though not using ice as often as heat. Goal status: MET  5.  Pt will verbalize understanding of task modifications and/or potential AE needs to increase ease, safety, and independence with IADLs.  Baseline: pain with cooking, chopping foods 06/24/23 - Pt reported return to cooking tasks. Pt reported sometimes "favoring" L hand and not always using as much strength "as I should be." Pt reported chopping foods "fine, a lot better." 07/08/23 - Pt reported returned to "pretty much everything." Pt reported some difficulty with carrying heavy trays and completing golf swing. Goal status: in progress   LONG TERM GOALS: Target date: 07/17/23  Pt will demonstrate improved UE functional use for ADLs as evidenced by increasing box/ blocks score by 5 blocks with LUE. Baseline:  06/24/23 - RUE: 52 blocks, LUE: 51  blocks Goal status: MET  2.  Pt will improve A/ROM in left wrist for flexion and extension to at least 90% as compared to right, to have functional motion for tasks like reach and grasp.  Baseline: LUE ROM at 50% or less of RUE 06/24/23 - Continued stiffness of LUE compared to RUE. LUE impaired wrist flex. LUE wrist ext [facility]. 07/08/23 - LUE wrist flex: 32* (compared to RUE wrist flex: 59*), LUE wrist ext: 50* WFL (compared to RUE wrist ext: 50*. Goal status: in progress  3.  Pt will demonstrate improved grip strength to >30 #  with LUE to  demonstrate functional grasp. Baseline: TBD 06/29/23: R: 28# and L: 12.67# 07/01/23 - RUE: 28#, 35# (31.5# average). LUE:11#, 13# (12# on average) 07/08/23 - LUE: 14, 15, 12 (13.7 lbs average) Goal status: in progress  4.  Pt will report improved functional use of LUE as evidenced by decreased score on QuickDASH by 25% Baseline: 54.5% impairment 06/29/23: 34.1% impairment 07/08/23 - 22.7% deficit Goal status: MET  5.  Pt will decrease pain at worst from 5/10 to 1/10 or less during grasp and release and power grip in preparation for being able to play pain free golf with left hand and occupational participation in daily roles. Baseline:  07/01/23 - Pt reporting 0/10 or 1/10 pain levels for past 3 OT sessions.  Goal status: MET   ASSESSMENT:  CLINICAL IMPRESSION: Pt demonstrating improvements with grip strength this session.  Pt continues to demonstrate stiffness and decreased ROM for wrist flexion of affected UE. Pt has been cleared my MD to return to all activities and reports playing better than she expected during a round of golf on Saturday.  Pt does still report pain with increased use and stiffness impacting her golf swing.  Pt to focus on grip strength and wrist flexion and use of heat prior to exercises prior to next session. Pt will continue to benefit from skilled occupational therapy services to address strength and coordination, ROM, pain management,  GM/FM control, safety awareness, introduction of compensatory strategies/AE prn, and implementation of an HEP to improve participation and safety during ADLs, IADLs, and leisure pursuits.    PERFORMANCE DEFICITS: in functional skills including ADLs, IADLs, coordination, edema, ROM, strength, pain, flexibility, Gross motor control, body mechanics, decreased knowledge of precautions, and UE functional use and psychosocial skills including environmental adaptation and routines and behaviors.      PLAN:  OT FREQUENCY: 2x/week  OT DURATION: 6 weeks  PLANNED INTERVENTIONS: 97168 OT Re-evaluation, 97535 self care/ADL training, 82956 therapeutic exercise, 97530 therapeutic activity, 97112 neuromuscular re-education, 97140 manual therapy, 97035 ultrasound, 97018 paraffin, 21308 moist heat, 97010 cryotherapy, 97760 Orthotics management and training, 65784 Splinting (initial encounter), [id number] Subsequent splinting/medication, passive range of motion, compression bandaging, coping strategies training, patient/family education, and DME and/or AE instructions  RECOMMENDED OTHER SERVICES: NA  CONSULTED AND AGREED WITH PLAN OF CARE: Patient  PLAN FOR NEXT SESSION:  Assess goals and D/C after next session?  Review AROM/AAROM and theraputty, (or wringing towel as alterative to theraputty for grip strength)  continue to progress AAROM/PROM exercises within pain threshold, okay to begin hand exerciser, Continue to progress strengthening and ROM as tolerated    Sullivan Blasing, OTR/L 07/13/2023, 12:43 PM  [facility] [address], [address] [place], [place] [address] Phone # [phone] Fax # [phone]

## 2023-07-15 ENCOUNTER — Ambulatory Visit: Admitting: Occupational Therapy

## 2023-07-15 DIAGNOSIS — M25532 Pain in left wrist: Secondary | ICD-10-CM | POA: Diagnosis not present

## 2023-07-15 DIAGNOSIS — R278 Other lack of coordination: Secondary | ICD-10-CM

## 2023-07-15 DIAGNOSIS — M6281 Muscle weakness (generalized): Secondary | ICD-10-CM

## 2023-07-15 DIAGNOSIS — M25632 Stiffness of left wrist, not elsewhere classified: Secondary | ICD-10-CM

## 2023-07-15 NOTE — Therapy (Signed)
 OUTPATIENT OCCUPATIONAL THERAPY ORTHO Treatment & Discharge  Patient Name: Cynthia Pace MRN: [id number] DOB:12/06/1960, 63 y.o., female Today's Date: 07/15/2023  PCP: Audria Leather, MD REFERRING PROVIDER: Wes Hamman, MD  OCCUPATIONAL THERAPY DISCHARGE SUMMARY  Visits from Start of Care: 12  Current functional level related to goals / functional outcomes:  Pt has met 5 of 5 STGs and 3 of 5 LTGs.  Pt continues to demonstrate decreased L wrist flexion in comparison to R wrist and continued decreased grip strength compared to R.  Pt however has demonstrated good improvements in grip strength over the past week with return to golfing and completing meal prep at home without too much concern from a grip strength and ROM standpoint.  Pt demonstrates and verbalizes understanding of continued engagement in HEP with focus on wrist flexion/extension and grip strength and use of modalities for pain relief and ROM.  Remaining deficits: L wrist flexion and grip strength   Education / Equipment: HEP for ROM and grip strengthening, education on modalities for pain management and increased mobility, educated on joint protection strategies.   Patient agrees to discharge. Patient goals were partially met. Patient is being discharged due to being pleased with the current functional level..     END OF SESSION:  OT End of Session - 07/15/23 1114     Visit Number 12    Number of Visits 13    Date for OT Re-Evaluation 07/17/23    Authorization Type [employer] 2025    OT Start Time 1106    OT Stop Time 1145    OT Time Calculation (min) 39 min    Activity Tolerance Patient tolerated treatment well    Behavior During Therapy WFL for tasks assessed/performed                      Past Medical History:  Diagnosis Date   Arthritis    "left ankle" (05/04/2015)   Asthma    Carpal tunnel syndrome    Chronic bronchitis (HCC)    Chronic lower back pain    Elevated liver  enzymes    Environmental allergies    Family history of adverse reaction to anesthesia    "daughter died after 2nd epidural for C-section; they were able to bring her back"   GERD (gastroesophageal reflux disease)    History of kidney stones    History of TMJ syndrome    Hyperlipidemia    Hypertension    Kidney stones    Restless leg syndrome    Seasonal allergies    Past Surgical History:  Procedure Laterality Date   ABDOMINAL HYSTERECTOMY     ANKLE SURGERY Left    severed artery and tendons   APPENDECTOMY     CYSTOSCOPY W/ STONE MANIPULATION  X 1   FRACTURE SURGERY     JOINT REPLACEMENT     ORIF HUMERUS FRACTURE Left 05/03/2015   Procedure: OPEN REDUCTION INTERNAL FIXATION (ORIF) LEFT PROXIMAL HUMERUS FRACTURE NONUNION;  Surgeon: Jasmine Mesi, MD;  Location: [facility] OR;  Service: Orthopedics;  Laterality: Left;   TONSILLECTOMY     TOTAL HIP ARTHROPLASTY Left 12/29/2017   TOTAL HIP ARTHROPLASTY Left 12/29/2017   Procedure: LEFT TOTAL HIP ARTHROPLASTY ANTERIOR APPROACH;  Surgeon: Jasmine Mesi, MD;  Location: [facility] OR;  Service: Orthopedics;  Laterality: Left;   Patient Active Problem List   Diagnosis Date Noted   Closed fracture of left distal radius 04/22/2023   Avascular necrosis of bone  of left hip (HCC)    Hip arthritis 12/29/2017   Lumbar pain 06/08/2017   Shoulder fracture, left 05/03/2015    ONSET DATE: fx 04/14/23 (referral date 05/28/23)  REFERRING DIAG: S52.572A (ICD-10-CM) - Other closed intra-articular fracture of distal end of left radius, initial encounter M25.532 (ICD-10-CM) - Pain in left wrist  THERAPY DIAG:  Stiffness of left wrist, not elsewhere classified  Muscle weakness (generalized)  Other lack of coordination  Pain in left wrist  Rationale for Evaluation and Treatment: Rehabilitation  SUBJECTIVE:   SUBJECTIVE STATEMENT: Pt reports cutting a lot of vegetables yesterday, preparing meals for upcoming grandchild.  Pt accompanied by:  self  PERTINENT HISTORY: 63 year old female who presented to ED 04/14/23 with left wrist pain.  Patient reports that she tripped and fell catching herself on her left wrist.  She did have some alcohol tonight.  X-rays reviewed and show distal radius fracture with an ulnar styloid fracture.  Patient was placed in a sugar-tong splint and given a sling.  Cast removed by Dr. Christiane [name] 05/28/23 and prefabricated wrist orthosis donned.  PRECAUTIONS: None  RED FLAGS: None   WEIGHT BEARING RESTRICTIONS: No  PAIN:  Are you having pain? Yes: NPRS scale: 2/10  Pain location: hand Pain description: dull Aggravating factors: rainy weather, hx of arthritis, increased use with cutting vegetables yesterday Relieving factors:    FALLS: Has patient fallen in last 6 months? Yes. 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  "This was the first and only fall this year".  LIVING ENVIRONMENT: Lives with: lives with their spouse Lives in: House/apartment Stairs: Yes: Internal: full flights of steps; and External: threshold steps Has following equipment at home: Walker - 2 wheeled - not currently using it  PLOF: Independent, Independent with basic ADLs, Independent with community mobility without device, Independent with homemaking with ambulation, and Leisure: playing golf  PATIENT GOALS: to be able to swing a club, decreased pain with household chores  NEXT MD VISIT: 07/09/23  OBJECTIVE:  Note: Objective measures were completed at Evaluation unless otherwise noted.  HAND DOMINANCE: Right  ADLs: Overall ADLs: difficulty opening pill bottles, difficulty with getting dressed due to decreased grasp and pain  Grooming: decreased strength to push toothpaste Upper body dressing: clothing fasteners Lower body dressing: mild difficulty and pain with pulling up pants  FUNCTIONAL OUTCOME MEASURES: Quick  Dash: 54.5%   QuickDASH: 34.1%   07/08/23 - 22.7% deficit   UPPER EXTREMITY ROM:     Active ROM Right eval Left eval  Shoulder flexion    Shoulder abduction    Shoulder adduction    Shoulder extension    Shoulder internal rotation    Shoulder external rotation    Elbow flexion    Elbow extension    Wrist flexion  80-90 60 12  Wrist extension  60-80 55 28  Wrist ulnar deviation  30-45 42 18  Wrist radial deviation 15-20 15 8   Wrist pronation WFL Pain  Wrist supination WFL 80% with increased pain  (Blank rows = not tested)  HAND FUNCTION: TBD - loose fist on L  COORDINATION: TBD  SENSATION: WFL  EDEMA: mild swelling in dorsal and volar aspect of L wrist  COGNITION: Overall cognitive status: Within functional limits for tasks assessed   TREATMENT DATE:  07/15/23 Massage:  OT providing massage in linear and circular pattern at volar aspect of  wrist.  OT utilizing lotion to reduce friction and aid in mobility to facilitate increased ROM. PROM: OT providing hand over hand to provide additional stretch and manipulation of wrist flexion.  Pt still with decreased flexion compared to R wrist.  Pt demonstrating good technique and understanding of importance of continued wrist flexion/extension both with exercises and functionally. Grip strength: Left: 16, 17, 20 (average 17.6# average) lbs.  Pt reporting improved grip strength allowing her to complete more tasks in the kitchen - with peeling potatoes, chopping vegetables, and pouring from various size/weight pots with increased control.   07/13/23 Flex bar: engaged in massed practice with wrist flexion on red flex bar.  OT providing demonstration and cues for sustained hold for 3-5 seconds.  Pt completing x15 with focus on improved flexion and incorporation of grip strength. Hand gripper:  with LUE on level 10# with yellow spring, progressing to #15 with black spring, and ultimately 20# with yellow spring.  Pt picking up 1  inch blocks with gripper with 0 drops with 10# and 15# springs.  Pt demonstrating decreased sequencing of task with increased resistance.  Pt able to complete task with 20# resistance spring with increased time and reports increased pain in ulnar distribution and through 4th digit (due to previous injury). Moist heat: OT reiterated use of moist heat for pain management and as preparatory task for wrist flexion stretch.  Pt reports that she has been using ice for pain management after playing golf but will plan to begin using heat for increased ROM and in preparation for exercises.    PATIENT EDUCATION: Education details: see today's tx above Person educated: Patient Education method: Explanation, Demonstration, and Handouts Education comprehension: verbalized understanding and needs further education  HOME EXERCISE PROGRAM: 06/02/23 - AROM (wrist flexion/extension, supination/pronation) see pt instructions 06/10/23 - yellow theraputty, Access Code: [email or web address]. Wrist prayer stretch AAROM (see pt instructions) 06/24/23 - wrist PROM flex, Access Code: [email or web address] (same as above) 07/01/23 - Verbal instructions: wringing washcloth/dish towel for grip strength - 10 reps, 2 sets, 2x per day (alternative option to theraputty)  GOALS: Goals reviewed with patient? Yes  SHORT TERM GOALS: Target date: 06/26/23  Pt will be independent in AROM, PROM, and strengthening HEP with use of handouts. Baseline: new to OPOT 07/08/23 - Pt reported no questions/concerns about HEP. Goal status: MET  2.  Pt will be independent with splint wear and care and report understanding of progressive decrease in wear time of orthosis and/or times when she may continue to benefit from wear of splint. Baseline: wrist orthosis provided by MD 06/24/23 - Pt no longer wearing splint except sometimes for community activities. Pt verbalized understanding of weaning from splint wear schedule. Goal status: MET  3.  Pt will verbalize understanding  of improved sleep positioning to decrease onset of pain and tingling as well as reduction of edema. Baseline: pain, tingling, and swelling after sleep 06/24/23 - Pt reported pain, tingling symptoms have improved. Pt ind recalled using pillows as supports to improve sleep positioning.  Goal status: MET  4.  Pt will verbalize understanding of use of modalities to decrease inflammation/pain.  Baseline: new to OPOT 06/24/23 - Pt reported using heat pack and paraffin wax to relax muscles and to manage pain.  Pt ind recalled using ice to reduce swelling though not using ice as often as heat. Goal status: MET  5.  Pt will verbalize understanding of task modifications and/or potential AE needs to increase ease, safety, and independence with IADLs.  Baseline: pain with cooking, chopping foods 06/24/23 - Pt reported return to cooking tasks. Pt reported sometimes "favoring" L hand and not always using as much strength "as I should be." Pt reported chopping foods "fine, a lot better." 07/08/23 - Pt reported returned to "pretty much everything." Pt reported some difficulty with carrying heavy trays and completing golf swing. 07/15/23 - pt has returned to playing golf, reporting that she was able to putt and hit with the driver in straight line.   Goal status: MET    LONG TERM GOALS: Target date: 07/17/23  Pt will demonstrate improved UE functional use for ADLs as evidenced by increasing box/ blocks score by 5 blocks with LUE. Baseline:  06/24/23 - RUE: 52 blocks, LUE: 51 blocks Goal status: MET  2.  Pt will improve A/ROM in left wrist for flexion and extension to at least 90% as compared to right, to have functional motion for tasks like reach and grasp.  Baseline: LUE ROM at 50% or less of RUE 06/24/23 - Continued stiffness of LUE compared to RUE. LUE impaired wrist flex. LUE wrist ext [facility] 07/08/23 - LUE wrist flex: 32* (compared to RUE wrist flex: 59*), LUE wrist ext: 50* WFL (compared to RUE wrist ext:  50*. 07/15/23 - LUE wrist flexion: 34* (compare to RUE wrist flex 59*) Goal status: Not met  3.  Pt will demonstrate improved grip strength to >30 #  with LUE to demonstrate functional grasp. Baseline: TBD 06/29/23: R: 28# and L: 12.67# 07/01/23 - RUE: 28#, 35# (31.5# average). LUE:11#, 13# (12# on average) 07/08/23 - LUE: 14, 15, 12 (13.7 lbs average) 07/15/23 - LUE: Left: 16, 17, 20 (average 17.6# average) lbs. Goal status: Not met   4.  Pt will report improved functional use of LUE as evidenced by decreased score on QuickDASH by 25% Baseline: 54.5% impairment 06/29/23: 34.1% impairment 07/08/23 - 22.7% deficit Goal status: MET  5.  Pt will decrease pain at worst from 5/10 to 1/10 or less during grasp and release and power grip in preparation for being able to play pain free golf with left hand and occupational participation in daily roles. Baseline:  07/01/23 - Pt reporting 0/10 or 1/10 pain levels for past 3 OT sessions.  Goal status: MET   ASSESSMENT:  CLINICAL IMPRESSION: Pt demonstrating improvements with grip strength this session, especially when compared to grip strength on 07/08/23.  Pt does still continue to demonstrate stiffness and decreased ROM for wrist flexion of affected UE.  Pt has resumed engagement in leisure tasks, playing a round of golf, with minimal increase in pain after full round of golf.  Pt reports understanding of continued ROM exercises with focus on wrist flexion and continued engagement in functional tasks to facilitate continued improvements in grip strength.   Pt verbalizing agreement with d/c at this time and understanding of recommendations for return to therapy if increased pain or decreased progression with wrist flexion and grip strength.  PERFORMANCE DEFICITS: in functional skills including ADLs, IADLs, coordination, edema, ROM, strength, pain, flexibility, Gross motor control, body mechanics, decreased knowledge of precautions, and UE functional use and  psychosocial skills including environmental adaptation and routines and behaviors.      PLAN:  OT FREQUENCY: 2x/week  OT DURATION: 6 weeks  PLANNED INTERVENTIONS: 97168 OT Re-evaluation, 97535 self care/ADL training, 16109 therapeutic exercise, 97530 therapeutic activity, 97112 neuromuscular re-education, 97140 manual therapy, 97035 ultrasound, 97018 paraffin, 60454 moist heat, 97010 cryotherapy, 97760 Orthotics management and training, 09811 Splinting (initial  encounter), [phone] Subsequent splinting/medication, passive range of motion, compression bandaging, coping strategies training, patient/family education, and DME and/or AE instructions  RECOMMENDED OTHER SERVICES: NA  CONSULTED AND AGREED WITH PLAN OF CARE: Patient    Anthonette Kinsman, OTR/L 07/15/2023, 11:14 AM  [facility] [address] [place], [place] [address] Phone # [phone] Fax # [phone]

## 2023-12-28 ENCOUNTER — Encounter: Payer: Self-pay | Admitting: Radiology

## 2024-02-04 NOTE — Progress Notes (Addendum)
 VELERIA BARNHARDT                                          MRN: [id number]   02/04/2024   The [facility] Quality Team Specialist reviewed this patient medical record for the purposes of chart review for care gap closure. The following were reviewed: chart review for care gap closure-colorectal cancer screening.  03/16/2024- cbp out of range    [facility] Quality Team
